# Patient Record
Sex: Female | Born: 1995 | Race: Black or African American | Hispanic: No | Marital: Single | Smoking: Never smoker
Health system: Southern US, Community
[De-identification: ages and names within clinical notes are randomized; demographics above are authoritative.]

## PROBLEM LIST (undated history)

## (undated) DIAGNOSIS — B009 Herpesviral infection, unspecified: Secondary | ICD-10-CM

## (undated) HISTORY — PX: WISDOM TOOTH EXTRACTION: SHX21

---
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
## 2017-03-01 ENCOUNTER — Encounter (HOSPITAL_COMMUNITY): Payer: Self-pay | Admitting: Emergency Medicine

## 2017-03-01 ENCOUNTER — Emergency Department (HOSPITAL_COMMUNITY): Payer: BLUE CROSS/BLUE SHIELD

## 2017-03-01 ENCOUNTER — Emergency Department (HOSPITAL_COMMUNITY)
Admission: EM | Admit: 2017-03-01 | Discharge: 2017-03-01 | Disposition: A | Discharge status: Home / Self Care | Payer: BLUE CROSS/BLUE SHIELD | Attending: Emergency Medicine | Admitting: Emergency Medicine

## 2017-03-01 DIAGNOSIS — M79671 Pain in right foot: Secondary | ICD-10-CM | POA: Diagnosis not present

## 2017-03-01 DIAGNOSIS — Z79899 Other long term (current) drug therapy: Secondary | ICD-10-CM | POA: Insufficient documentation

## 2017-03-01 NOTE — Discharge Instructions (Signed)
You have been seen today for toe pain. There were no acute abnormalities on the x-rays, including no sign of fracture or dislocation. Pain: Take 600 mg of ibuprofen every 6 hours or 440 mg (over the counter dose) to 500 mg (prescription dose) of naproxen every 12 hours or for the next 3 days. After this time, these medications may be used as needed for pain. Take these medications with food to avoid upset stomach. Choose only one of these medications, do not take them together.  Tylenol: Should you continue to have additional pain while taking the ibuprofen or naproxen, you may add in tylenol as needed. Your daily total maximum amount of tylenol from all sources should be limited to 4000mg /day for persons without liver problems, or 2000mg /day for those with liver problems. Ice: May apply ice to the area over the next 24 hours for 15 minutes at a time to reduce swelling. Elevation: Keep the extremity elevated as often as possible to reduce pain and inflammation. Support: You will be weight-bearing as tolerated, which means you can slowly start to put weight on the extremity and increase amount and frequency as pain allows. Follow up: If symptoms are improving, you may follow up with your primary care provider for any continued management. If symptoms are not improving, you may follow up with the orthopedic specialist.

## 2017-03-01 NOTE — ED Notes (Signed)
Bed: WTR5 Expected date:  Expected time:  Means of arrival:  Comments: 

## 2017-03-01 NOTE — ED Provider Notes (Signed)
[facility]-EMERGENCY DEPT Provider Note   CSN: [id number] Arrival date & time: 03/01/17  0935   By signing my name below, I, Anita Levy, attest that this documentation has been prepared under the direction and in the presence of Anita Chavarin, PA-C. Electronically signed, Anita Levy, ED Scribe. 03/01/17. 10:27 AM.  History   Chief Complaint Chief Complaint  Patient presents with  . Foot Pain   The history is provided by the patient and medical records. No language interpreter was used.    Anita Levy is an otherwise healthy 21 y.o. female presenting to the Emergency Department concerning R 4th toe pain onset ~2-3 days ago. She states she was standing, playing video games barefoot at home when the pain began. Associated swelling. She describes 7/10, throbbing, fluctuating pain worse with ambulation. Pt has taken Tylenol and attempted epsom salt soaks with minimal relief. Denies fever, neuro deficits, known trauma, or any other complaints.   History reviewed. No pertinent past medical history.  There are no active problems to display for this patient.   Past Surgical History:  Procedure Laterality Date  . WISDOM TOOTH EXTRACTION      OB History    No data available       Home Medications    Prior to Admission medications   Medication Sig Start Date End Date Taking? Authorizing Provider  amoxicillin-clavulanate (AUGMENTIN) 875-125 MG per tablet Take 1 tablet by mouth 2 (two) times daily. 09/27/14   Ethelda ChickSmith, Kristi M, MD  azithromycin (ZITHROMAX) 250 MG tablet Take 1 tablet (250 mg total) by mouth daily. Take first 2 tablets together, then 1 every day until finished. 05/07/16   Teressa LowerPickering, Vrinda, NP  diphenhydrAMINE (BENADRYL) 25 mg capsule Take 25 mg by mouth every 6 (six) hours as needed. itching     [provider]  DPH-Lido-AlHydr-MgHydr-Simeth (FIRST-MOUTHWASH BLM) SUSP 5 ml every 1-2h prn sore throat - swish gargle spit. 09/26/14   Weber, Dema SeverinSarah L, PA-C    fexofenadine-pseudoephedrine (ALLEGRA-D 24) 180-240 MG per 24 hr tablet Take 1 tablet by mouth daily.    [provider]  guaiFENesin (MUCINEX) 600 MG 12 hr tablet Take by mouth 2 (two) times daily.    [provider]  ibuprofen (ADVIL,MOTRIN) 200 MG tablet Take 200 mg by mouth every 6 (six) hours as needed. pain     [provider]    Family History Family History  Problem Relation Age of Onset  . Cancer Mother     Social History Social History  Substance Use Topics  . Smoking status: Never Smoker  . Smokeless tobacco: Never Used  . Alcohol use No     Allergies   Codeine and Penicillins   Review of Systems Review of Systems  Constitutional: Negative for fever.  Musculoskeletal: Positive for arthralgias.  Skin: Negative for color change and wound.  Neurological: Negative for weakness and numbness.     Physical Exam Updated Vital Signs BP 115/70 (BP Location: Right Arm)   Pulse 84   Temp 98.2 F (36.8 C) (Oral)   Wt 116 lb (52.6 kg)   LMP 02/15/2017 (Exact Date)   SpO2 98%   BMI 18.72 kg/m   Physical Exam  Constitutional: She appears well-developed and well-nourished. No distress.  HENT:  Head: Normocephalic and atraumatic.  Eyes: Conjunctivae are normal.  Neck: Neck supple.  Cardiovascular: Normal rate, regular rhythm and intact distal pulses.   Pulmonary/Chest: Effort normal.  Musculoskeletal: Normal range of motion. She exhibits tenderness. She exhibits no edema  or deformity.  Tenderness over the first joint of the fourth digit on the R foot. No increased warmth, erythema, or area of fluctuance noted. No swelling noted.  Nail is in place. No subungual swelling or discharge.  Neurological: She is alert.  No sensory deficits noted to the toes of the right foot. Strength 5 out of 5 in the foot and ankle.  Skin: Skin is warm and dry. Capillary refill takes less than 2 seconds. She is not diaphoretic. No erythema. No pallor.   Psychiatric: She has a normal mood and affect. Her behavior is normal.  Nursing note and vitals reviewed.    ED Treatments / Results  DIAGNOSTIC STUDIES: Oxygen Saturation is 98% on RA, NL by my interpretation.    COORDINATION OF CARE: 10:24 AM-Discussed next steps with pt. Pt verbalized understanding and is agreeable with the plan. Will order Xr and Rx medications. Pt advised of symptomatic care at home, F/U instructions and return precautions.   Labs (all labs ordered are listed, but only abnormal results are displayed) Labs Reviewed - No data to display  EKG  EKG Interpretation None       Radiology  Dg Foot Complete Right  Result Date: 03/01/2017 CLINICAL DATA:  Right fourth toe pain for 2 or 3 days. No known injury. EXAM: RIGHT FOOT COMPLETE - 3+ VIEW COMPARISON:  Radiographs 08/12/2011.  MRI 08/16/2012. FINDINGS: The mineralization and alignment are normal. There is no evidence of acute fracture or dislocation. The joint spaces are maintained. Mild soft tissue prominence in the fourth toe appears unchanged. No evidence of foreign body. IMPRESSION: Stable right foot radiographs.  No acute findings. Electronically Signed   By: Carey Bullocks M.D.   On: 03/01/2017 10:53   Procedures Procedures (including critical care time)  Medications Ordered in ED Medications - No data to display   Initial Impression / Assessment and Plan / ED Course  I have reviewed the triage vital signs and the nursing notes.  Pertinent labs & imaging results that were available during my care of the patient were reviewed by me and considered in my medical decision making (see chart for details).     Patient presents with toe pain. No neuro deficits noted. No signs of infection. No acute abnormalities on x-ray. PCP follow-up as needed. The patient was given instructions for home care as well as return precautions. Patient voices understanding of these instructions, accepts the plan, and is  comfortable with discharge.  Final Clinical Impressions(s) / ED Diagnoses   Final diagnoses:  Foot pain, right    New Prescriptions Discharge Medication List as of 03/01/2017 11:07 AM    I personally performed the services described in this documentation, which was scribed in my presence. The recorded information has been reviewed and is accurate.   Anselm Pancoast, PA-C 03/03/17 1058    Arby Barrette, MD 03/03/17 [phone]

## 2017-03-01 NOTE — ED Triage Notes (Signed)
Pt reports 3 day hx of pain and swelling in r/foot. Pain noted primarily at 4th toe. Tx with epsom soaks and tylenol.Minimal relief.

## 2017-03-04 ENCOUNTER — Encounter: Payer: Self-pay | Admitting: Podiatry

## 2017-03-04 ENCOUNTER — Ambulatory Visit (INDEPENDENT_AMBULATORY_CARE_PROVIDER_SITE_OTHER): Payer: PRIVATE HEALTH INSURANCE | Admitting: Podiatry

## 2017-03-04 DIAGNOSIS — M7750 Other enthesopathy of unspecified foot: Secondary | ICD-10-CM

## 2017-03-04 MED ORDER — MELOXICAM 15 MG PO TABS: 15.0000 mg | ORAL_TABLET | Freq: Every day | ORAL | 0 refills | Status: DC

## 2017-03-04 NOTE — Progress Notes (Signed)
   Subjective:    Patient ID: Anita Levy, female    DOB: [birth year], 21 y.o.   MRN: [id number]  HPI   I had pain around the 4th toe on my right foot spreading some to the other toes had lot of swelling and it looked purple.  I went to ER X-rays were done they couldn't find anything so sent me here    Review of Systems  Neurological: Positive for headaches.  All other systems reviewed and are negative.      Objective:   Physical Exam        Assessment & Plan:

## 2017-03-04 NOTE — Progress Notes (Signed)
Patient ID: Anita Levy, female   DOB: [birth year], 21 y.o.   MRN: [id number]   HPI: 21 year old female presents the office today for evaluation of right fourth toe pain. Patient states that approximate 3 days ago she had this sudden cramping in her right fourth toe and all of a sudden became painful and swollen. There was some bruising and discoloration also noted. Patient states that is painful to walk. Today the discoloration and swelling is gone however the toe remains painful.   Physical Exam: General: The patient is alert and oriented x3 in no acute distress.  Dermatology: Skin is warm, dry and supple bilateral lower extremities. Negative for open lesions or macerations.  Vascular: Palpable pedal pulses bilaterally. No edema or erythema noted. Capillary refill within normal limits.  Neurological: Epicritic and protective threshold grossly intact bilaterally.   Musculoskeletal Exam: Pain on palpation to the fourth digit right foot with pain with forced plantar flexion of the fourth digit. There is some weakness also noted with plantar flexion of the fourth digit right foot. Range of motion within normal limits to all pedal and ankle joints bilateral. Muscle strength 5/5 in all groups bilateral.   Assessment: 1. FDL Tendinitis fourth digit right foot   Plan of Care:  1. Patient was evaluated. 2. Today a postoperative shoe was dispensed. Patient be weightbearing in postoperative shoe times 2-3 weeks when necessary pain 3. Prescription for meloxicam 15 mg 4. Return to clinic when necessary   Felecia ShellingBrent M. Dominique Ressel, DPM [facility]  Dr. Felecia ShellingBrent M. Lashae Wollenberg, DPM    [address]                                        [place], [place] [address]                Office [phone]  Fax [phone]

## 2017-03-18 ENCOUNTER — Ambulatory Visit: Payer: PRIVATE HEALTH INSURANCE | Admitting: Podiatry

## 2017-09-07 IMAGING — CR DG CHEST 2V
2 series · 2 of 2 positions shown · non-contrast
Comparison: None.

CLINICAL DATA: Patient with coughing at night while lying down.
Shortness of breath. No chest pain.

EXAM:
CHEST  2 VIEW

[w chest pa]
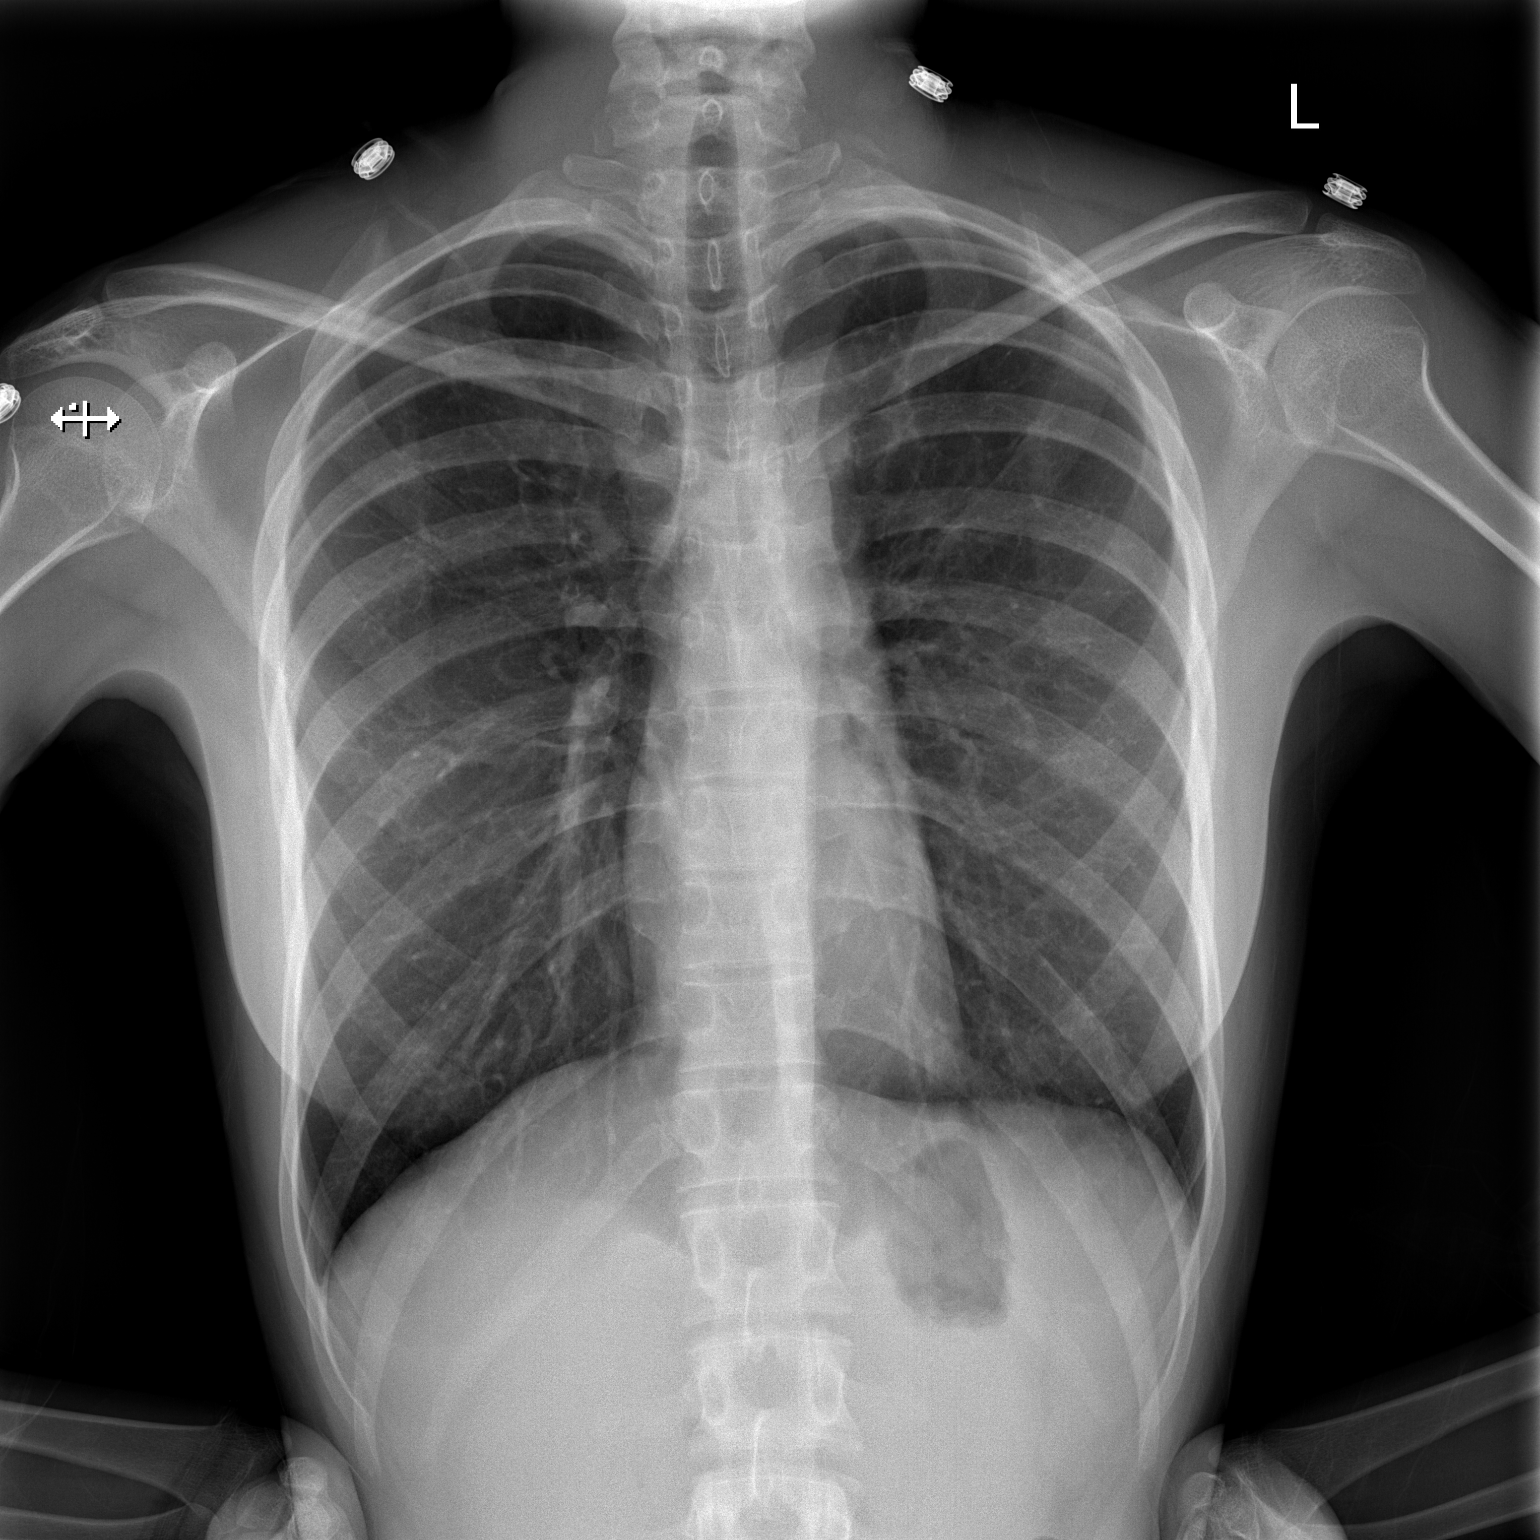

[w chest lat]
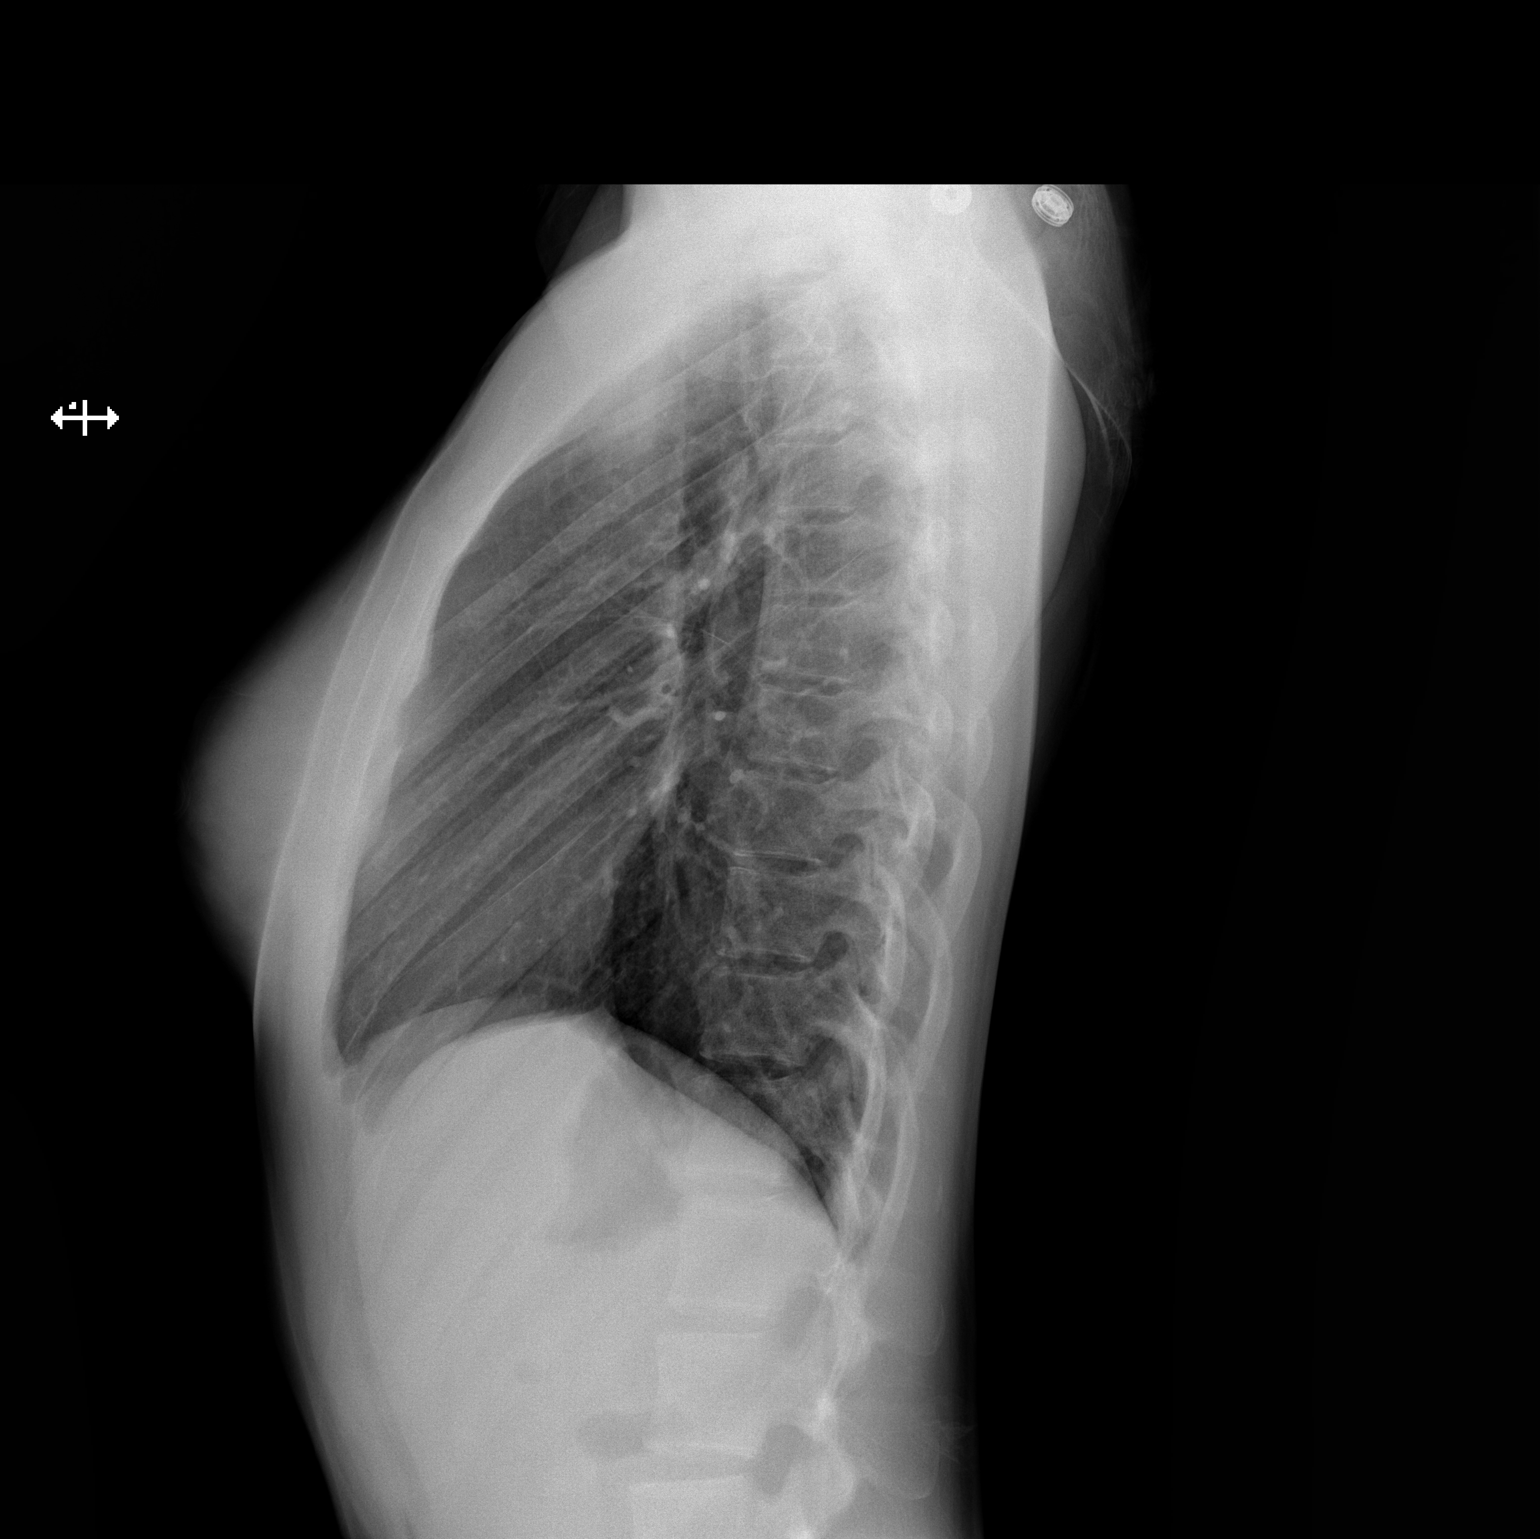

[2 of 2 positions shown; findings below may reference images not displayed]

FINDINGS: The heart size and mediastinal contours are within normal limits.
Both lungs are clear. The visualized skeletal structures are
unremarkable.
IMPRESSION: No active cardiopulmonary disease.

## 2017-11-06 ENCOUNTER — Ambulatory Visit: Payer: 59 | Admitting: Emergency Medicine

## 2017-11-06 ENCOUNTER — Encounter: Payer: Self-pay | Admitting: Emergency Medicine

## 2017-11-06 ENCOUNTER — Other Ambulatory Visit: Payer: Self-pay

## 2017-11-06 ENCOUNTER — Ambulatory Visit: Payer: PRIVATE HEALTH INSURANCE | Admitting: Emergency Medicine

## 2017-11-06 VITALS — BP 102/70 | HR 67 | Temp 100.3°F | Resp 16 | Ht 65.5 in | Wt 118.0 lb

## 2017-11-06 DIAGNOSIS — R509 Fever, unspecified: Secondary | ICD-10-CM

## 2017-11-06 DIAGNOSIS — J029 Acute pharyngitis, unspecified: Secondary | ICD-10-CM

## 2017-11-06 LAB — POCT RAPID STREP A (OFFICE): RAPID STREP A SCREEN: NEGATIVE

## 2017-11-06 MED ORDER — AZITHROMYCIN 250 MG PO TABS: ORAL_TABLET | ORAL | 0 refills | Status: DC

## 2017-11-06 NOTE — Progress Notes (Signed)
Anita Levy 22 y.o.   Chief Complaint  Patient presents with  . Dizziness    started 11/04/17  . Sore Throat    HISTORY OF PRESENT ILLNESS: This is a 22 y.o. female complaining of sore throat and fever for 2 days.  Sore Throat   This is a new problem. The current episode started in the past 7 days. The problem has been gradually worsening. The maximum temperature recorded prior to her arrival was 101 - 101.9 F. The fever has been present for 1 to 2 days. The pain is moderate. Associated symptoms include headaches, swollen glands and trouble swallowing. Pertinent negatives include no abdominal pain, congestion, coughing, diarrhea, drooling, ear pain, neck pain, shortness of breath or vomiting. She has had no exposure to strep or mono.     Prior to Admission medications   Medication Sig Start Date End Date Taking? Authorizing Provider  diphenhydrAMINE (BENADRYL) 25 mg capsule Take 25 mg by mouth every 6 (six) hours as needed. itching    Yes [provider]  fexofenadine-pseudoephedrine (ALLEGRA-D 24) 180-240 MG per 24 hr tablet Take 1 tablet by mouth daily.   Yes [provider]  guaiFENesin (MUCINEX) 600 MG 12 hr tablet Take by mouth 2 (two) times daily.   Yes [provider]  ibuprofen (ADVIL,MOTRIN) 200 MG tablet Take 200 mg by mouth every 6 (six) hours as needed. pain    Yes [provider]  norethindrone-ethinyl estradiol (JUNEL FE,GILDESS FE,LOESTRIN FE) 1-20 MG-MCG tablet Take 1 tablet by mouth daily.   Yes [provider]  amoxicillin-clavulanate (AUGMENTIN) 875-125 MG per tablet Take 1 tablet by mouth 2 (two) times daily. Patient not taking: Reported on 03/04/2017 09/27/14   Ethelda ChickSmith, Kristi M, MD  azithromycin (ZITHROMAX) 250 MG tablet Take 1 tablet (250 mg total) by mouth daily. Take first 2 tablets together, then 1 every day until finished. Patient not taking: Reported on 03/04/2017 05/07/16   Teressa LowerPickering, Vrinda, NP    DPH-Lido-AlHydr-MgHydr-Simeth (FIRST-MOUTHWASH BLM) SUSP 5 ml every 1-2h prn sore throat - swish gargle spit. Patient not taking: Reported on 03/04/2017 09/26/14   Morrell RiddleWeber, Sarah L, PA-C  meloxicam (MOBIC) 15 MG tablet Take 1 tablet (15 mg total) by mouth daily. Patient not taking: Reported on 11/06/2017 03/04/17   Felecia ShellingEvans, Brent M, DPM    Allergies  Allergen Reactions  . Codeine Itching  . Penicillins Swelling    There are no active problems to display for this patient.   No past medical history on file.  Past Surgical History:  Procedure Laterality Date  . WISDOM TOOTH EXTRACTION      Social History   Socioeconomic History  . Marital status: Single    Spouse name: Not on file  . Number of children: Not on file  . Years of education: Not on file  . Highest education level: Not on file  Occupational History  . Not on file  Social Needs  . Financial resource strain: Not on file  . Food insecurity:    Worry: Not on file    Inability: Not on file  . Transportation needs:    Medical: Not on file    Non-medical: Not on file  Tobacco Use  . Smoking status: Never Smoker  . Smokeless tobacco: Never Used  Substance and Sexual Activity  . Alcohol use: No    Alcohol/week: 0.0 oz  . Drug use: No  . Sexual activity: Not on file  Lifestyle  . Physical activity:    Days  per week: Not on file    Minutes per session: Not on file  . Stress: Not on file  Relationships  . Social connections:    Talks on phone: Not on file    Gets together: Not on file    Attends religious service: Not on file    Active member of club or organization: Not on file    Attends meetings of clubs or organizations: Not on file    Relationship status: Not on file  . Intimate partner violence:    Fear of current or ex partner: Not on file    Emotionally abused: Not on file    Physically abused: Not on file    Forced sexual activity: Not on file  Other Topics Concern  . Not on file  Social History  Narrative  . Not on file    Family History  Problem Relation Age of Onset  . Cancer Mother      Review of Systems  Constitutional: Positive for chills and fever.  HENT: Positive for sore throat and trouble swallowing. Negative for congestion, drooling and ear pain.   Eyes: Negative.  Negative for discharge and redness.  Respiratory: Negative for cough and shortness of breath.   Cardiovascular: Negative.  Negative for chest pain and palpitations.  Gastrointestinal: Negative for abdominal pain, blood in stool, diarrhea, nausea and vomiting.  Genitourinary: Negative.  Negative for dysuria and hematuria.  Musculoskeletal: Negative for myalgias and neck pain.  Skin: Negative.  Negative for rash.  Neurological: Positive for dizziness and headaches.  Endo/Heme/Allergies: Negative.   All other systems reviewed and are negative.   Vitals:   11/06/17 0826  BP: 102/70  Pulse: 67  Resp: 16  Temp: 100.3 F (37.9 C)  SpO2: 97%    Physical Exam  Constitutional: She is oriented to person, place, and time. She appears well-developed and well-nourished.  HENT:  Head: Normocephalic and atraumatic.  Right Ear: External ear normal.  Left Ear: External ear normal.  Nose: Nose normal.  Mouth/Throat: Uvula is midline. Oropharyngeal exudate, posterior oropharyngeal edema and posterior oropharyngeal erythema present. No tonsillar abscesses.  Eyes: Pupils are equal, round, and reactive to light. Conjunctivae and EOM are normal.  Neck: Normal range of motion. Neck supple.  Cardiovascular: Normal rate and regular rhythm.  Pulmonary/Chest: Effort normal and breath sounds normal.  Abdominal: Soft. There is no tenderness.  Musculoskeletal: Normal range of motion.  Neurological: She is alert and oriented to person, place, and time. No sensory deficit. She exhibits normal muscle tone. Coordination normal.  Skin: Skin is warm and dry. Capillary refill takes less than 2 seconds. No rash noted.    Psychiatric: She has a normal mood and affect. Her behavior is normal.  Vitals reviewed.  Results for orders placed or performed in visit on 11/06/17 (from the past 24 hour(s))  POCT rapid strep A     Status: None   Collection Time: 11/06/17  8:51 AM  Result Value Ref Range   Rapid Strep A Screen Negative Negative    Acute pharyngitis Clinically patient has all the findings of strep throat.  Will treat accordingly.  Allergic to penicillin will start azithromycin.   ASSESSMENT & PLAN: Anita Levy was seen today for dizziness and sore throat.  Diagnoses and all orders for this visit:  Acute pharyngitis, unspecified etiology  Sore throat -     Culture, Group A Strep -     POCT rapid strep A -     azithromycin (ZITHROMAX) 250  MG tablet; Sig as indicated  Fever, unspecified fever cause    Patient Instructions       IF you received an x-ray today, you will receive an invoice from [facility]. Please contact [facility] at [phone] with questions or concerns regarding your invoice.   IF you received labwork today, you will receive an invoice from [place]. Please contact [facility] at [phone] with questions or concerns regarding your invoice.   Our billing staff will not be able to assist you with questions regarding bills from these companies.  You will be contacted with the lab results as soon as they are available. The fastest way to get your results is to activate your My Chart account. Instructions are located on the last page of this paperwork. If you have not heard from [place] regarding the results in 2 weeks, please contact this office.     Pharyngitis Pharyngitis is a sore throat (pharynx). There is redness, pain, and swelling of your throat. Follow these instructions at home:  Drink enough fluids to keep your pee (urine) clear or pale yellow.  Only take medicine as told by your doctor. ? You may get sick again if you do not take medicine as  told. Finish your medicines, even if you start to feel better. ? Do not take aspirin.  Rest.  Rinse your mouth (gargle) with salt water ( tsp of salt per 1 qt of water) every 1-2 hours. This will help the pain.  If you are not at risk for choking, you can suck on hard candy or sore throat lozenges. Contact a doctor if:  You have large, tender lumps on your neck.  You have a rash.  You cough up green, yellow-brown, or bloody spit. Get help right away if:  You have a stiff neck.  You drool or cannot swallow liquids.  You throw up (vomit) or are not able to keep medicine or liquids down.  You have very bad pain that does not go away with medicine.  You have problems breathing (not from a stuffy nose). This information is not intended to replace advice given to you by your health care provider. Make sure you discuss any questions you have with your health care provider. Document Released: 01/16/2008 Document Revised: 01/05/2016 Document Reviewed: 04/06/2013 Elsevier Interactive Patient Education  2017 Elsevier Inc.      Edwina Barth, MD [facility]

## 2017-11-06 NOTE — Assessment & Plan Note (Signed)
Clinically patient has all the findings of strep throat.  Will treat accordingly.  Allergic to penicillin will start azithromycin.

## 2017-11-06 NOTE — Patient Instructions (Addendum)
     IF you received an x-ray today, you will receive an invoice from [facility]. Please contact [facility] at [phone] with questions or concerns regarding your invoice.   IF you received labwork today, you will receive an invoice from [facility]. Please contact [facility] at [phone] with questions or concerns regarding your invoice.   Our billing staff will not be able to assist you with questions regarding bills from these companies.  You will be contacted with the lab results as soon as they are available. The fastest way to get your results is to activate your My Chart account. Instructions are located on the last page of this paperwork. If you have not heard from us regarding the results in 2 weeks, please contact this office.     Pharyngitis Pharyngitis is a sore throat (pharynx). There is redness, pain, and swelling of your throat. Follow these instructions at home:  Drink enough fluids to keep your pee (urine) clear or pale yellow.  Only take medicine as told by your doctor.  You may get sick again if you do not take medicine as told. Finish your medicines, even if you start to feel better.  Do not take aspirin.  Rest.  Rinse your mouth (gargle) with salt water ( tsp of salt per 1 qt of water) every 1-2 hours. This will help the pain.  If you are not at risk for choking, you can suck on hard candy or sore throat lozenges. Contact a doctor if:  You have large, tender lumps on your neck.  You have a rash.  You cough up green, yellow-brown, or bloody spit. Get help right away if:  You have a stiff neck.  You drool or cannot swallow liquids.  You throw up (vomit) or are not able to keep medicine or liquids down.  You have very bad pain that does not go away with medicine.  You have problems breathing (not from a stuffy nose). This information is not intended to replace advice given to you by your health care provider. Make sure you  discuss any questions you have with your health care provider. Document Released: 01/16/2008 Document Revised: 01/05/2016 Document Reviewed: 04/06/2013 Elsevier Interactive Patient Education  2017 Elsevier Inc.  

## 2017-11-08 ENCOUNTER — Encounter: Payer: Self-pay | Admitting: *Deleted

## 2017-11-08 LAB — CULTURE, GROUP A STREP: Strep A Culture: NEGATIVE

## 2017-11-08 NOTE — Progress Notes (Signed)
Letter sent.

## 2017-11-09 ENCOUNTER — Telehealth: Payer: Self-pay | Admitting: Emergency Medicine

## 2017-11-09 NOTE — Telephone Encounter (Signed)
Phone call to patient's mother Aggie Cosierheresa. She states lethargy started Friday, but patient is looking and feeling better today- she is much less lethargic. Ann-Marie has been able to eat today. Recommended reviewing instructions for medications, reviewed information from [facility]. Recommended Aggie Cosierheresa call with any new or worsening symptoms. She is agreeable.

## 2017-11-09 NOTE — Telephone Encounter (Signed)
Pt.'s mother Reports the pt. being nauseous, lethargic, and that her right side hurts. All symptoms started after taking the z-pack prescribed at her visit with Dr. Alvy BimlerSagardia on 11/06/17. The pt.s mother declined to make an appt. And wished for a message to the doctor asking for advice be written on her behalf. The pt. Is willing to make an appt. should it be needed   Best Number: [phone]  Please Advise

## 2017-11-11 NOTE — Telephone Encounter (Signed)
Thanks

## 2017-12-17 DIAGNOSIS — N644 Mastodynia: Secondary | ICD-10-CM | POA: Diagnosis not present

## 2018-03-11 DIAGNOSIS — R5383 Other fatigue: Secondary | ICD-10-CM | POA: Diagnosis not present

## 2018-03-11 DIAGNOSIS — R109 Unspecified abdominal pain: Secondary | ICD-10-CM | POA: Diagnosis not present

## 2018-03-11 DIAGNOSIS — N912 Amenorrhea, unspecified: Secondary | ICD-10-CM | POA: Diagnosis not present

## 2018-08-15 ENCOUNTER — Emergency Department (HOSPITAL_COMMUNITY): Payer: BLUE CROSS/BLUE SHIELD

## 2018-08-15 ENCOUNTER — Emergency Department (HOSPITAL_COMMUNITY)
Admission: EM | Admit: 2018-08-15 | Discharge: 2018-08-15 | Disposition: A | Discharge status: Home / Self Care | Payer: BLUE CROSS/BLUE SHIELD | Attending: Emergency Medicine | Admitting: Emergency Medicine

## 2018-08-15 ENCOUNTER — Other Ambulatory Visit: Payer: Self-pay

## 2018-08-15 ENCOUNTER — Encounter (HOSPITAL_COMMUNITY): Payer: Self-pay

## 2018-08-15 DIAGNOSIS — Z79899 Other long term (current) drug therapy: Secondary | ICD-10-CM | POA: Insufficient documentation

## 2018-08-15 DIAGNOSIS — M79672 Pain in left foot: Secondary | ICD-10-CM | POA: Insufficient documentation

## 2018-08-15 MED ORDER — ACETAMINOPHEN 325 MG PO TABS
650.0000 mg | ORAL_TABLET | Freq: Once | ORAL | Status: AC
  Administered 2018-08-15: 650 mg via ORAL
  Filled 2018-08-15: qty 2

## 2018-08-15 NOTE — ED Provider Notes (Signed)
[facility]-EMERGENCY DEPT Provider Note   CSN: [id number] Arrival date & time: 08/15/18  1748     History   Chief Complaint Chief Complaint  Patient presents with  . Left Foot Injury    HPI Anita Levy is a 23 y.o. female.  HPI   23 year old female presents with a 6-week history of left foot pain.  Patient states symptoms started after she hit the top of her foot on a porcelain soap container.  Patient then 2 weeks later hit her foot again.  Pain has persisted since then.  She put herself in a postop shoe to try to help with her pain but this has not helped.  She states her pain is now on the lateral aspect of the left foot and at the third through fifth MTPs.  She denies any numbness, tingling, decreased range of motion of ankle.  She denies any redness, swelling, fevers.  History reviewed. No pertinent past medical history.  Patient Active Problem List   Diagnosis Date Noted  . Acute pharyngitis 11/06/2017  . Sore throat 11/06/2017  . Fever 11/06/2017    Past Surgical History:  Procedure Laterality Date  . WISDOM TOOTH EXTRACTION       OB History   No obstetric history on file.      Home Medications    Prior to Admission medications   Medication Sig Start Date End Date Taking? Authorizing Provider  amoxicillin-clavulanate (AUGMENTIN) 875-125 MG per tablet Take 1 tablet by mouth 2 (two) times daily. Patient not taking: Reported on 03/04/2017 09/27/14   Ethelda ChickSmith, Kristi M, MD  azithromycin (ZITHROMAX) 250 MG tablet Take 1 tablet (250 mg total) by mouth daily. Take first 2 tablets together, then 1 every day until finished. Patient not taking: Reported on 03/04/2017 05/07/16   Teressa LowerPickering, Vrinda, NP  azithromycin [facility](ZITHROMAX) 250 MG tablet Sig as indicated 11/06/17   Georgina QuintSagardia, Miguel Jose, MD  diphenhydrAMINE (BENADRYL) 25 mg capsule Take 25 mg by mouth every 6 (six) hours as needed. itching     [provider]  DPH-Lido-AlHydr-MgHydr-Simeth  (FIRST-MOUTHWASH BLM) SUSP 5 ml every 1-2h prn sore throat - swish gargle spit. Patient not taking: Reported on 03/04/2017 09/26/14   Valarie ConesWeber, Dema SeverinSarah L, PA-C  fexofenadine-pseudoephedrine (ALLEGRA-D 24) 180-240 MG per 24 hr tablet Take 1 tablet by mouth daily.    [provider]  guaiFENesin (MUCINEX) 600 MG 12 hr tablet Take by mouth 2 (two) times daily.    [provider]  ibuprofen (ADVIL,MOTRIN) 200 MG tablet Take 200 mg by mouth every 6 (six) hours as needed. pain     [provider]  meloxicam (MOBIC) 15 MG tablet Take 1 tablet (15 mg total) by mouth daily. Patient not taking: Reported on 11/06/2017 03/04/17   Felecia ShellingEvans, Brent M, DPM  norethindrone-ethinyl estradiol (JUNEL FE,GILDESS FE,LOESTRIN FE) 1-20 MG-MCG tablet Take 1 tablet by mouth daily.    [provider]    Family History Family History  Problem Relation Age of Onset  . Cancer Mother     Social History Social History   Tobacco Use  . Smoking status: Never Smoker  . Smokeless tobacco: Never Used  Substance Use Topics  . Alcohol use: No    Alcohol/week: 0.0 standard drinks  . Drug use: No     Allergies   Codeine and Penicillins   Review of Systems Review of Systems  Constitutional: Negative for chills and fever.  Respiratory: Negative for shortness of breath.   Cardiovascular: Negative  for chest pain.  Gastrointestinal: Negative for abdominal pain, nausea and vomiting.  Musculoskeletal: Positive for arthralgias (left foot pain). Negative for joint swelling.  Skin: Negative for rash and wound.     Physical Exam Updated Vital Signs BP 131/83 (BP Location: Right Arm)   Pulse 93   Temp 98.9 F (37.2 C) (Oral)   Resp 18   Ht 5\' 7"  (1.702 m)   Wt 50.8 kg   LMP 07/12/2018 (Approximate)   SpO2 100%   BMI 17.54 kg/m   Physical Exam Vitals signs and nursing note reviewed.  Constitutional:      Appearance: She is well-developed.  HENT:     Head: Normocephalic and atraumatic.   Eyes:     Conjunctiva/sclera: Conjunctivae normal.  Neck:     Musculoskeletal: Neck supple.  Cardiovascular:     Rate and Rhythm: Normal rate and regular rhythm.     Heart sounds: Normal heart sounds. No murmur.  Pulmonary:     Effort: Pulmonary effort is normal. No respiratory distress.     Breath sounds: Normal breath sounds. No wheezing or rales.  Abdominal:     General: Bowel sounds are normal. There is no distension.     Palpations: Abdomen is soft.     Tenderness: There is no abdominal tenderness.  Musculoskeletal: Normal range of motion.        General: No tenderness or deformity.       Feet:  Skin:    General: Skin is warm and dry.     Findings: No erythema or rash.  Neurological:     Mental Status: She is alert and oriented to person, place, and time.  Psychiatric:        Behavior: Behavior normal.      ED Treatments / Results  Labs (all labs ordered are listed, but only abnormal results are displayed) Labs Reviewed - No data to display  EKG None  Radiology Dg Foot Complete Left  Result Date: 08/15/2018 CLINICAL DATA:  The patient suffered a blow to the left foot 07/10/2018. Continued pain and difficulty weight-bearing. Initial encounter. EXAM: LEFT FOOT - COMPLETE 3+ VIEW COMPARISON:  None. FINDINGS: There is no evidence of fracture or dislocation. There is no evidence of arthropathy or other focal bone abnormality. Soft tissues are unremarkable. IMPRESSION: Normal exam. Electronically Signed   By: Drusilla Kannerhomas  Dalessio M.D.   On: 08/15/2018 20:31    Procedures Procedures (including critical care time)  Medications Ordered in ED Medications  acetaminophen (TYLENOL) tablet 650 mg (has no administration in time range)     Initial Impression / Assessment and Plan / ED Course  I have reviewed the triage vital signs and the nursing notes.  Pertinent labs & imaging results that were available during my care of the patient were reviewed by me and considered in my  medical decision making (see chart for details).     Patient presents with a 6-week history of left foot pain.  Patient is resting comfortably in a wheelchair, no acute distress, nontoxic, non-lethargic.  No signs stable.  X-ray shows no acute findings.  Has been ambulating on the lateral aspect of her left foot since injury to the top of her foot.  And now she has pain in the lateral aspect of her foot.  Her foot has no erythema/edema/ecchymosis.  It is neurovascularly intact.  When the provider tries to get patient to ambulate by putting her foot flat on the ground, patient ambulates much better and without difficulty.  She is still requesting crutches due to pain.  Encouraged her to follow-up outpatient with orthopedics.  She will be going back to school at [employer] and mother states they will follow-up there.  Ready and stable for discharge.  Final Clinical Impressions(s) / ED Diagnoses   Final diagnoses:  None    ED Discharge Orders    None       Rueben Bash 08/15/18 2150    Arby Barrette, MD 08/17/18 2214

## 2018-08-15 NOTE — Discharge Instructions (Signed)
Use crutches as needed for comfort.  Ice affected area.  Take Tylenol or Motrin as needed for pain.  Follow-up with orthopedics within a week for continued evaluation.  Return to the ED immediately for new or worsening symptoms, such as increased pain, swelling, numbness, decreased range of motion or any concerns at all.

## 2018-08-15 NOTE — ED Triage Notes (Signed)
Patient comes from home with mother at side. Patient is AOx4 and unable to put any weight on left foot. Patient hit left foot on porcelain soap dish in shower during thanksgiving weekend. Left foot is not getting any better and pain has been increasing in left foot. Patient has no other complaints.

## 2019-12-16 IMAGING — CR DG FOOT COMPLETE 3+V*L*
3 series · 3 of 3 positions shown · non-contrast
Comparison: None.

CLINICAL DATA: The patient suffered a blow to the left foot
07/10/2018. Continued pain and difficulty weight-bearing. Initial
encounter.

EXAM:
LEFT FOOT - COMPLETE 3+ VIEW

[x foot ap left]
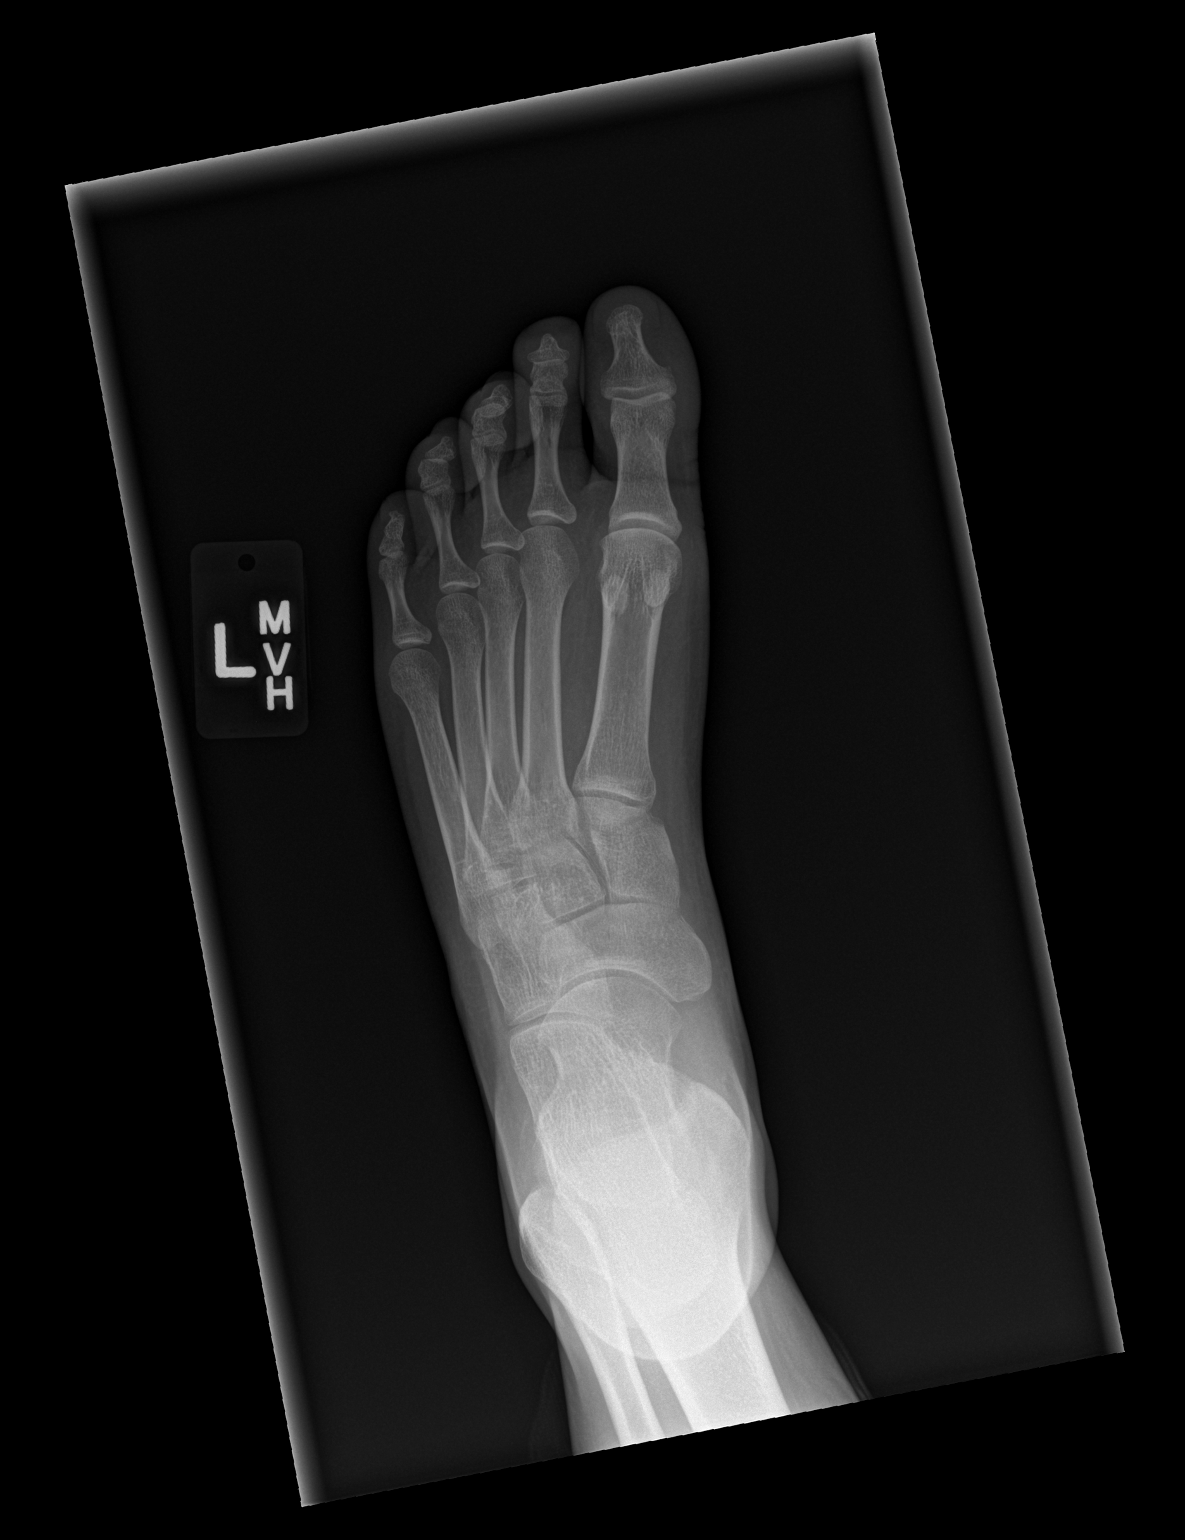

[x foot obl left]
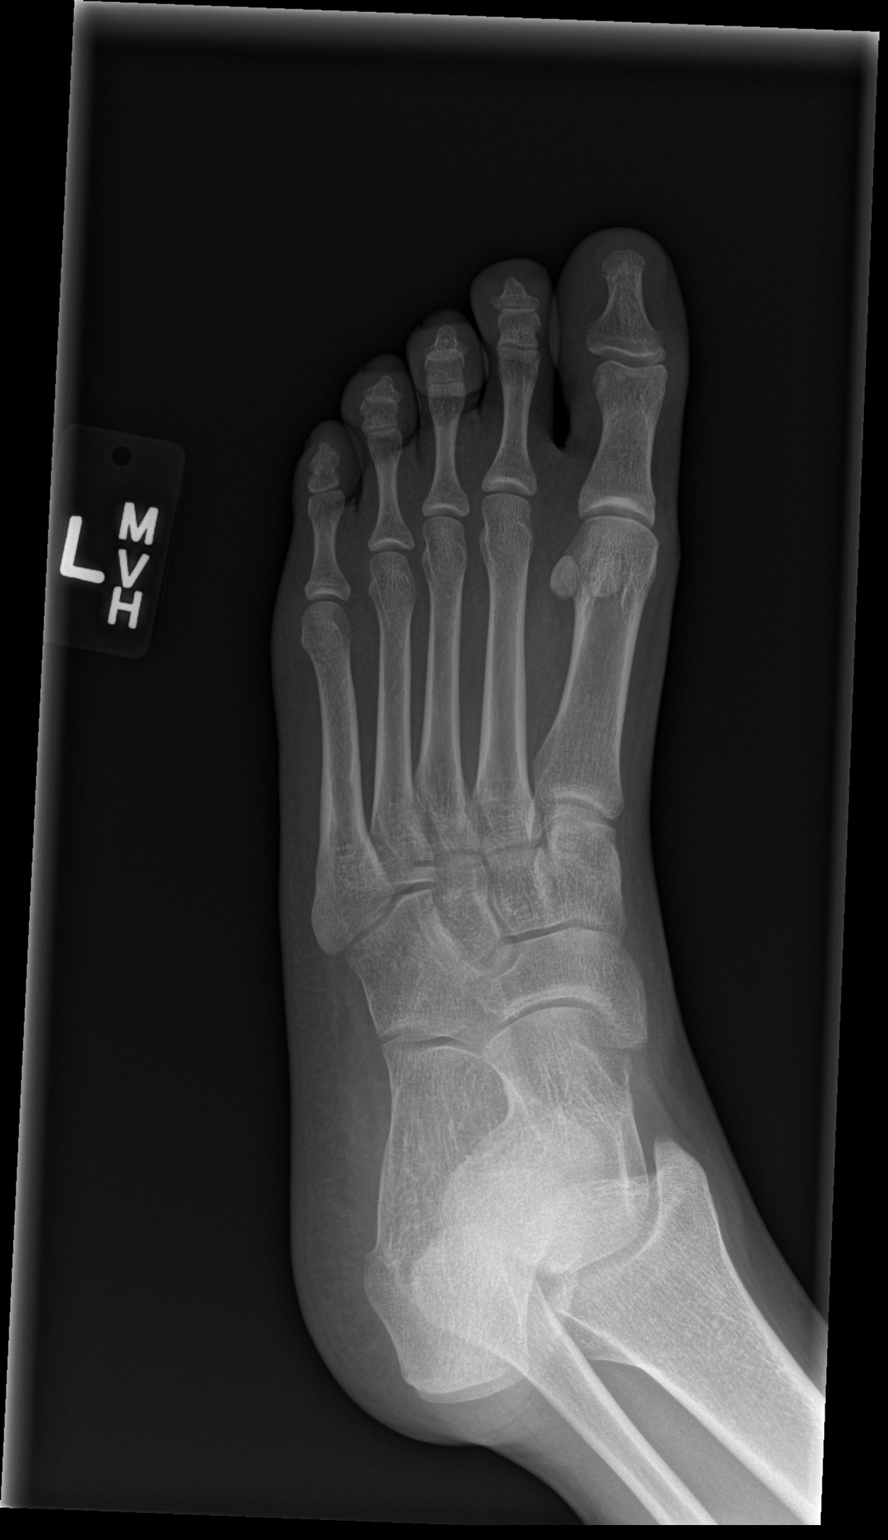

[x foot lat left]
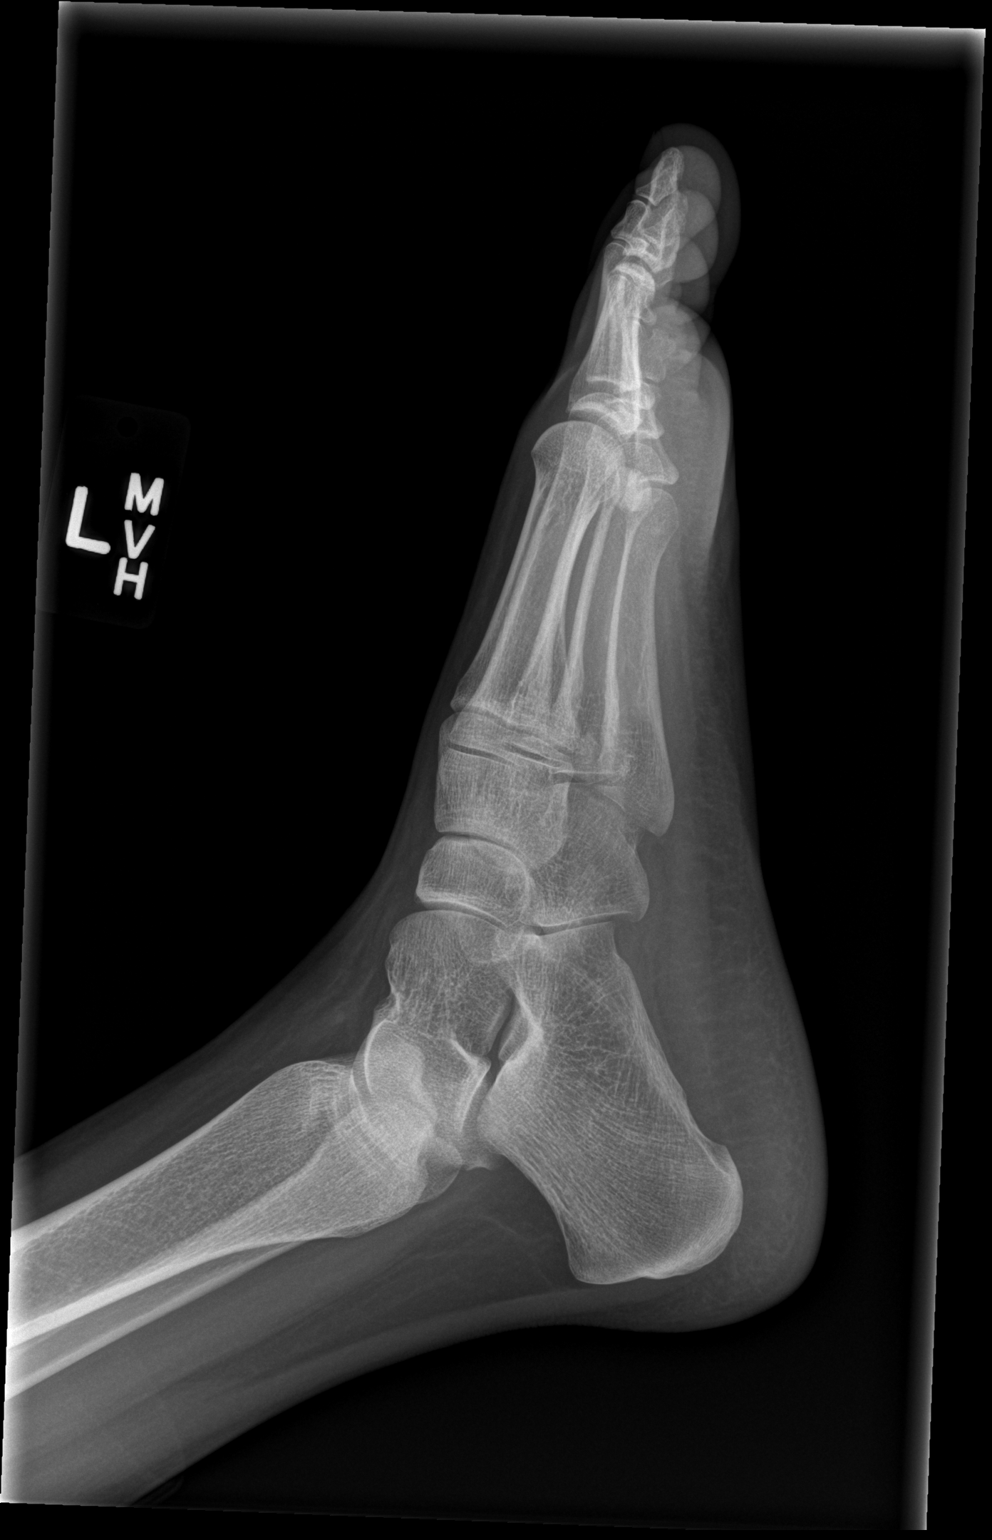

[3 of 3 positions shown; findings below may reference images not displayed]

FINDINGS: There is no evidence of fracture or dislocation. There is no
evidence of arthropathy or other focal bone abnormality. Soft
tissues are unremarkable.
IMPRESSION: Normal exam.

## 2020-08-25 ENCOUNTER — Other Ambulatory Visit: Payer: Self-pay

## 2020-08-25 DIAGNOSIS — Z20822 Contact with and (suspected) exposure to covid-19: Secondary | ICD-10-CM

## 2020-08-28 LAB — NOVEL CORONAVIRUS, NAA: SARS-CoV-2, NAA: DETECTED — AB

## 2023-06-21 ENCOUNTER — Other Ambulatory Visit: Payer: Self-pay

## 2023-06-21 ENCOUNTER — Encounter (HOSPITAL_COMMUNITY): Payer: Self-pay | Admitting: Anesthesiology

## 2023-06-21 ENCOUNTER — Encounter (HOSPITAL_COMMUNITY): Payer: Self-pay | Admitting: Obstetrics and Gynecology

## 2023-06-21 NOTE — Progress Notes (Signed)
PCP - none Cardiologist - none  Chest x-ray - n/a EKG - n/a Stress Test - n/a ECHO - n/a Cardiac Cath - n/a  ICD Pacemaker/Loop - n/a  Sleep Study -  n/a  Diabetes - n/a  ERAS - Clear liquids til 12 Noon DOS.  STOP now taking any Aspirin (unless otherwise instructed by your surgeon), Aleve, Naproxen, Ibuprofen, Motrin, Advil, Goody's, BC's, all herbal medications, fish oil, and all vitamins.   Coronavirus Screening Do you have any of the following symptoms:  Cough yes/no: No Fever (>100.21F)  yes/no: No Runny nose yes/no: No Sore throat yes/no: No Difficulty breathing/shortness of breath  yes/no: No  Have you traveled in the last 14 days and where? yes/no: No  Patient verbalized understanding of instructions that were given via phone.

## 2023-06-23 DIAGNOSIS — Z880 Allergy status to penicillin: Secondary | ICD-10-CM

## 2023-06-23 DIAGNOSIS — B009 Herpesviral infection, unspecified: Secondary | ICD-10-CM | POA: Insufficient documentation

## 2023-06-23 DIAGNOSIS — O021 Missed abortion: Secondary | ICD-10-CM | POA: Diagnosis present

## 2023-06-23 NOTE — H&P (Signed)
Ryneisha Debski is an 27 y.o. female, G1P0, presenting for scheduled D&E due to missed AB of twins at 12 weeks by ? Dates, 9 weeks by [place].  Patient was seen in office on 06/20/23 for [place] for dating and viability. LMP was 03/24/23--US showed twin pregnancy, with fetal demise of both twins, verified by doppler flow.  Patient had denied any bleeding or pain.  Options for management were reviewed, with patient electing to proceed with D&E on 06/24/23.    She requested repeat US at [facility] on the am of 06/24/23 for verification of loss.  Fetal demise of both twins was confirmed and reviewed with patient by myself.  Blood type is unknown at the time of this note--will need to be drawn at the time of procedure.    Patient was unsure regarding genetic testing of the POC--Dr. Normand Sloop will review this with patient at time of procedure.  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 She has never used smokeless tobacco. She reports that she does not currently use drugs after having used the following drugs: Marijuana. She reports that she does not drink alcohol.  ALLERGIES/MEDS:  Allergies:  Allergies  Allergen Reactions   Codeine Itching   Penicillins Swelling    No medications prior to admission.     ROS: Denies bleeding  or pain  Height 5\' 7"  (1.702 m), weight 51.7 kg. Physical Exam Chest clear Heart RRR without murmur Abd soft, NT Fundal height 9 weeks, NT Pelvic deferred Ext WNL   ASSESSMENT: Missed AB of twins at 12 weeks by dates, 9 weeks by [place] Unknown blood type PCN allergy  PLAN: Admit for outpatient D&E per consult with Dr. Normand Sloop. Check ABO/RH at time of admission. Dr. Normand Sloop to review option of Anora testing of POC. Support to patient for loss.   Nigel Bridgeman, CNM 06/23/2023, 8:50 AM

## 2023-06-24 ENCOUNTER — Ambulatory Visit (HOSPITAL_COMMUNITY)
Admission: RE | Admit: 2023-06-24 | Discharge: 2023-06-24 | Disposition: A | Discharge status: Home / Self Care | Payer: BC Managed Care – PPO | Attending: Obstetrics and Gynecology | Admitting: Obstetrics and Gynecology

## 2023-06-24 ENCOUNTER — Other Ambulatory Visit: Payer: Self-pay | Admitting: Obstetrics and Gynecology

## 2023-06-24 ENCOUNTER — Other Ambulatory Visit: Payer: Self-pay

## 2023-06-24 ENCOUNTER — Encounter (HOSPITAL_COMMUNITY)
Admission: RE | Disposition: A | Discharge status: Home / Self Care | Payer: Self-pay | Source: Home / Self Care | Attending: Obstetrics and Gynecology

## 2023-06-24 ENCOUNTER — Encounter (HOSPITAL_COMMUNITY): Payer: Self-pay | Admitting: Obstetrics and Gynecology

## 2023-06-24 DIAGNOSIS — O021 Missed abortion: Secondary | ICD-10-CM | POA: Insufficient documentation

## 2023-06-24 DIAGNOSIS — Z538 Procedure and treatment not carried out for other reasons: Secondary | ICD-10-CM | POA: Diagnosis not present

## 2023-06-24 DIAGNOSIS — Z880 Allergy status to penicillin: Secondary | ICD-10-CM

## 2023-06-24 HISTORY — DX: Herpesviral infection, unspecified: B00.9

## 2023-06-24 SURGERY — DILATION AND EVACUATION, UTERUS
Anesthesia: General

## 2023-06-24 SURGICAL SUPPLY — 22 items
CATH ROBINSON RED A/P 16FR (CATHETERS) ×2 IMPLANT
DILATOR CANAL MILEX (MISCELLANEOUS) IMPLANT
FILTER UTR ASPR ASSEMBLY (MISCELLANEOUS) ×2 IMPLANT
GLOVE BIO SURGEON STRL SZ 6.5 (GLOVE) ×2 IMPLANT
GLOVE BIOGEL PI IND STRL 7.0 (GLOVE) ×4 IMPLANT
GOWN STRL REUS W/ TWL LRG LVL3 (GOWN DISPOSABLE) ×2 IMPLANT
GOWN STRL REUS W/TWL LRG LVL3 (GOWN DISPOSABLE)
HOSE CONNECTING 18IN BERKELEY (TUBING) ×2 IMPLANT
KIT BERKELEY 1ST TRI 3/8 NO TR (MISCELLANEOUS) ×2 IMPLANT
KIT BERKELEY 1ST TRIMESTER 3/8 (MISCELLANEOUS) ×2 IMPLANT
NS IRRIG 1000ML POUR BTL (IV SOLUTION) ×1 IMPLANT
PACK VAGINAL MINOR WOMEN LF (CUSTOM PROCEDURE TRAY) ×1 IMPLANT
PAD OB MATERNITY 4.3X12.25 (PERSONAL CARE ITEMS) ×1 IMPLANT
SET BERKELEY SUCTION TUBING (SUCTIONS) ×1 IMPLANT
SPIKE FLUID TRANSFER (MISCELLANEOUS) ×2 IMPLANT
SYR 20ML LL LF (SYRINGE) ×2 IMPLANT
TOWEL GREEN STERILE FF (TOWEL DISPOSABLE) ×4 IMPLANT
UNDERPAD 30X36 HEAVY ABSORB (UNDERPADS AND DIAPERS) ×2 IMPLANT
VACURETTE 10 RIGID CVD (CANNULA) IMPLANT
VACURETTE 7MM CVD STRL WRAP (CANNULA) IMPLANT
VACURETTE 8 RIGID CVD (CANNULA) IMPLANT
VACURETTE 9 RIGID CVD (CANNULA) IMPLANT

## 2023-06-24 NOTE — Progress Notes (Signed)
Patient states she ate a [employer] sandwich at 10:00 am. Dr. Alonna Buckler and Dr. Normand Sloop made aware. Surgery canceled and will be rescheduled per Dr. Normand Sloop. Dr. Normand Sloop came to Short Stay to talk to the patient prior to the patient leaving. The office will call the patient to reschedule per Dr. Normand Sloop.

## 2023-06-25 ENCOUNTER — Other Ambulatory Visit (HOSPITAL_COMMUNITY): Payer: Self-pay | Admitting: Obstetrics and Gynecology

## 2023-06-25 DIAGNOSIS — O039 Complete or unspecified spontaneous abortion without complication: Secondary | ICD-10-CM

## 2023-06-26 ENCOUNTER — Encounter (HOSPITAL_COMMUNITY): Payer: Self-pay | Admitting: Obstetrics and Gynecology

## 2023-06-26 ENCOUNTER — Other Ambulatory Visit: Payer: Self-pay

## 2023-06-26 NOTE — Progress Notes (Signed)
PCP - No PCP Cardiologist - denies  PPM/ICD - denies Device Orders - n/a Rep Notified - n/a  Chest x-ray - denies EKG - denies Stress Test - denies ECHO - denies Cardiac Cath - denies  CPAP - denies  DM denies  Blood Thinner Instructions: denies Aspirin Instructions: denies  ERAS Protcol - clear liquids until 8:00  COVID TEST- no  Anesthesia review: no  Patient verbally denies any shortness of breath, fever, cough and chest pain during phone call   -------------  SDW INSTRUCTIONS given:  Your procedure is scheduled on June 28, 2023.  Report to [facility] Main Entrance "A" at 8:30 A.M., and check in at the Admitting office.  Call this number if you have problems the morning of surgery:  [phone]   Remember:  Do not eat after midnight the night before your surgery  You may drink clear liquids until 8:00 the morning of your surgery.   Clear liquids allowed are: Water, Non-Citrus Juices (without pulp), Carbonated Beverages, Clear Tea, Black Coffee Only, and Gatorade    Take these medicines the morning of surgery with A SIP OF WATER  N/A  As of today, STOP taking any Aspirin (unless otherwise instructed by your surgeon) Aleve, Naproxen, Ibuprofen, Motrin, Advil, Goody's, BC's, all herbal medications, fish oil, and all vitamins.                      Do not wear jewelry, make up, or nail polish            Do not wear lotions, powders, perfumes/colognes, or deodorant.            Do not shave 48 hours prior to surgery.  Men may shave face and neck.            Do not bring valuables to the hospital.            [facility] is not responsible for any belongings or valuables.  Do NOT Smoke (Tobacco/Vaping) 24 hours prior to your procedure If you use a CPAP at night, you may bring all equipment for your overnight stay.   Contacts, glasses, dentures or bridgework may not be worn into surgery.      For patients admitted to the hospital, discharge time will be  determined by your treatment team.   Patients discharged the day of surgery will not be allowed to drive home, and someone needs to stay with them for 24 hours.    Special instructions:   [facility]- Preparing For Surgery  Before surgery, you can play an important role. Because skin is not sterile, your skin needs to be as free of germs as possible. You can reduce the number of germs on your skin by washing with CHG (chlorahexidine gluconate) Soap before surgery.  CHG is an antiseptic cleaner which kills germs and bonds with the skin to continue killing germs even after washing.    Oral Hygiene is also important to reduce your risk of infection.  Remember - BRUSH YOUR TEETH THE MORNING OF SURGERY WITH YOUR REGULAR TOOTHPASTE  Please do not use if you have an allergy to CHG or antibacterial soaps. If your skin becomes reddened/irritated stop using the CHG.  Do not shave (including legs and underarms) for at least 48 hours prior to first CHG shower. It is OK to shave your face.  Please follow these instructions carefully.   Shower the NIGHT BEFORE SURGERY and the MORNING OF SURGERY with DIAL  Soap.   Pat yourself dry with a CLEAN TOWEL.  Wear CLEAN PAJAMAS to bed the night before surgery  Place CLEAN SHEETS on your bed the night of your first shower and DO NOT SLEEP WITH PETS.   Day of Surgery: Please shower morning of surgery  Wear Clean/Comfortable clothing the morning of surgery Do not apply any deodorants/lotions.   Remember to brush your teeth WITH YOUR REGULAR TOOTHPASTE.   Questions were answered. Patient verbalized understanding of instructions.

## 2023-06-28 ENCOUNTER — Ambulatory Visit (HOSPITAL_COMMUNITY): Payer: Medicaid Other | Admitting: Anesthesiology

## 2023-06-28 ENCOUNTER — Encounter (HOSPITAL_COMMUNITY): Payer: Self-pay | Admitting: Obstetrics and Gynecology

## 2023-06-28 ENCOUNTER — Other Ambulatory Visit: Payer: Self-pay

## 2023-06-28 ENCOUNTER — Ambulatory Visit (HOSPITAL_COMMUNITY)
Admission: RE | Admit: 2023-06-28 | Discharge: 2023-06-28 | Disposition: A | Discharge status: Home / Self Care | Payer: Medicaid Other | Attending: Obstetrics and Gynecology | Admitting: Obstetrics and Gynecology

## 2023-06-28 ENCOUNTER — Encounter (HOSPITAL_COMMUNITY)
Admission: RE | Disposition: A | Discharge status: Home / Self Care | Payer: Self-pay | Source: Home / Self Care | Attending: Obstetrics and Gynecology

## 2023-06-28 ENCOUNTER — Ambulatory Visit (HOSPITAL_COMMUNITY)
Admission: RE | Admit: 2023-06-28 | Discharge: 2023-06-28 | Disposition: A | Discharge status: Home / Self Care | Payer: BC Managed Care – PPO | Source: Ambulatory Visit | Attending: Obstetrics and Gynecology | Admitting: Obstetrics and Gynecology

## 2023-06-28 DIAGNOSIS — O034 Incomplete spontaneous abortion without complication: Secondary | ICD-10-CM | POA: Insufficient documentation

## 2023-06-28 DIAGNOSIS — Z3A00 Weeks of gestation of pregnancy not specified: Secondary | ICD-10-CM | POA: Diagnosis not present

## 2023-06-28 DIAGNOSIS — O039 Complete or unspecified spontaneous abortion without complication: Secondary | ICD-10-CM

## 2023-06-28 DIAGNOSIS — O30001 Twin pregnancy, unspecified number of placenta and unspecified number of amniotic sacs, first trimester: Secondary | ICD-10-CM | POA: Diagnosis not present

## 2023-06-28 HISTORY — PX: DILATION AND EVACUATION: SHX1459

## 2023-06-28 LAB — CBC
HCT: 38.9 % (ref 36.0–46.0)
Hemoglobin: 12.7 g/dL (ref 12.0–15.0)
MCH: 30.2 pg (ref 26.0–34.0)
MCHC: 32.6 g/dL (ref 30.0–36.0)
MCV: 92.4 fL (ref 80.0–100.0)
Platelets: 300 10*3/uL (ref 150–400)
RBC: 4.21 MIL/uL (ref 3.87–5.11)
RDW: 13.3 % (ref 11.5–15.5)
WBC: 7.5 10*3/uL (ref 4.0–10.5)
nRBC: 0 % (ref 0.0–0.2)

## 2023-06-28 LAB — ABO/RH: ABO/RH(D): O POS

## 2023-06-28 LAB — TYPE AND SCREEN
ABO/RH(D): O POS
Antibody Screen: NEGATIVE

## 2023-06-28 SURGERY — DILATION AND EVACUATION, UTERUS
Anesthesia: Monitor Anesthesia Care

## 2023-06-28 MED ORDER — ACETAMINOPHEN 10 MG/ML IV SOLN: 1000.0000 mg | Freq: Once | INTRAVENOUS | Status: DC | PRN

## 2023-06-28 MED ORDER — PROPOFOL 10 MG/ML IV BOLUS
INTRAVENOUS | Status: DC | PRN
  Administered 2023-06-28: 40 mg via INTRAVENOUS
  Administered 2023-06-28: 50 mg via INTRAVENOUS
  Administered 2023-06-28 (×2): 30 mg via INTRAVENOUS

## 2023-06-28 MED ORDER — ORAL CARE MOUTH RINSE: 15.0000 mL | Freq: Once | OROMUCOSAL | Status: AC

## 2023-06-28 MED ORDER — DEXAMETHASONE SODIUM PHOSPHATE 10 MG/ML IJ SOLN
INTRAMUSCULAR | Status: DC | PRN
  Administered 2023-06-28: 10 mg via INTRAVENOUS

## 2023-06-28 MED ORDER — LIDOCAINE HCL 2 % IJ SOLN
INTRAMUSCULAR | Status: DC | PRN
  Administered 2023-06-28: 10 mL

## 2023-06-28 MED ORDER — LIDOCAINE 2% (20 MG/ML) 5 ML SYRINGE
INTRAMUSCULAR | Status: DC | PRN
  Administered 2023-06-28: 40 mg via INTRAVENOUS

## 2023-06-28 MED ORDER — OXYCODONE HCL 5 MG PO TABS: 5.0000 mg | ORAL_TABLET | Freq: Once | ORAL | Status: DC | PRN

## 2023-06-28 MED ORDER — ONDANSETRON HCL 4 MG/2ML IJ SOLN
INTRAMUSCULAR | Status: DC | PRN
  Administered 2023-06-28: 4 mg via INTRAVENOUS

## 2023-06-28 MED ORDER — METHYLERGONOVINE MALEATE 0.2 MG/ML IJ SOLN
INTRAMUSCULAR | Status: AC
  Filled 2023-06-28: qty 1

## 2023-06-28 MED ORDER — DEXAMETHASONE SODIUM PHOSPHATE 10 MG/ML IJ SOLN
INTRAMUSCULAR | Status: AC
  Filled 2023-06-28: qty 1

## 2023-06-28 MED ORDER — IBUPROFEN 600 MG PO TABS: 600.0000 mg | ORAL_TABLET | Freq: Four times a day (QID) | ORAL | 1 refills | Status: DC | PRN

## 2023-06-28 MED ORDER — ACETAMINOPHEN 325 MG PO TABS: 325.0000 mg | ORAL_TABLET | ORAL | Status: DC | PRN

## 2023-06-28 MED ORDER — LACTATED RINGERS IV SOLN
INTRAVENOUS | Status: DC
Start: 2023-06-28 — End: 2023-06-28

## 2023-06-28 MED ORDER — OXYCODONE HCL 5 MG/5ML PO SOLN
5.0000 mg | Freq: Once | ORAL | Status: DC | PRN
Start: 2023-06-28 — End: 2023-06-28

## 2023-06-28 MED ORDER — LIDOCAINE HCL 1 % IJ SOLN
INTRAMUSCULAR | Status: AC
  Filled 2023-06-28: qty 20

## 2023-06-28 MED ORDER — FENTANYL CITRATE (PF) 100 MCG/2ML IJ SOLN: INTRAMUSCULAR | Status: DC | PRN

## 2023-06-28 MED ORDER — FENTANYL CITRATE (PF) 100 MCG/2ML IJ SOLN: 25.0000 ug | INTRAMUSCULAR | Status: DC | PRN

## 2023-06-28 MED ORDER — POVIDONE-IODINE 10 % EX SWAB: 2.0000 | Freq: Once | CUTANEOUS | Status: DC

## 2023-06-28 MED ORDER — SCOPOLAMINE 1 MG/3DAYS TD PT72: 1.0000 | MEDICATED_PATCH | TRANSDERMAL | Status: DC

## 2023-06-28 MED ORDER — PROPOFOL 500 MG/50ML IV EMUL
INTRAVENOUS | Status: DC | PRN
  Administered 2023-06-28: 100 ug/kg/min via INTRAVENOUS

## 2023-06-28 MED ORDER — FENTANYL CITRATE (PF) 250 MCG/5ML IJ SOLN
INTRAMUSCULAR | Status: DC | PRN
  Administered 2023-06-28: 50 ug via INTRAVENOUS

## 2023-06-28 MED ORDER — PROPOFOL 10 MG/ML IV BOLUS
INTRAVENOUS | Status: AC
  Filled 2023-06-28: qty 20

## 2023-06-28 MED ORDER — FENTANYL CITRATE (PF) 250 MCG/5ML IJ SOLN
INTRAMUSCULAR | Status: AC
  Filled 2023-06-28: qty 5

## 2023-06-28 MED ORDER — DROPERIDOL 2.5 MG/ML IJ SOLN: 0.6250 mg | Freq: Once | INTRAMUSCULAR | Status: DC | PRN

## 2023-06-28 MED ORDER — ACETAMINOPHEN 500 MG PO TABS
1000.0000 mg | ORAL_TABLET | Freq: Four times a day (QID) | ORAL | 1 refills | Status: AC | PRN
Start: 2023-06-28 — End: ?

## 2023-06-28 MED ORDER — LIDOCAINE 2% (20 MG/ML) 5 ML SYRINGE
INTRAMUSCULAR | Status: AC
  Filled 2023-06-28: qty 5

## 2023-06-28 MED ORDER — CHLORHEXIDINE GLUCONATE 0.12 % MT SOLN
15.0000 mL | Freq: Once | OROMUCOSAL | Status: AC
Start: 2023-06-28 — End: 2023-06-28

## 2023-06-28 MED ORDER — PROPOFOL 1000 MG/100ML IV EMUL
INTRAVENOUS | Status: AC
  Filled 2023-06-28: qty 100

## 2023-06-28 MED ORDER — MIDAZOLAM HCL 2 MG/2ML IJ SOLN
INTRAMUSCULAR | Status: DC | PRN
  Administered 2023-06-28: 2 mg via INTRAVENOUS

## 2023-06-28 MED ORDER — ONDANSETRON HCL 4 MG/2ML IJ SOLN
INTRAMUSCULAR | Status: AC
  Filled 2023-06-28: qty 2

## 2023-06-28 MED ORDER — LIDOCAINE-EPINEPHRINE 2 %-1:100000 IJ SOLN
INTRAMUSCULAR | Status: AC
  Filled 2023-06-28: qty 1

## 2023-06-28 MED ORDER — ACETAMINOPHEN 160 MG/5ML PO SOLN: 325.0000 mg | ORAL | Status: DC | PRN

## 2023-06-28 MED ORDER — CHLORHEXIDINE GLUCONATE 0.12 % MT SOLN
OROMUCOSAL | Status: AC
  Administered 2023-06-28: 15 mL via OROMUCOSAL
  Filled 2023-06-28: qty 15

## 2023-06-28 MED ORDER — OXYCODONE HCL 5 MG PO TABS: 5.0000 mg | ORAL_TABLET | Freq: Four times a day (QID) | ORAL | 0 refills | Status: DC | PRN

## 2023-06-28 MED ORDER — MIDAZOLAM HCL 2 MG/2ML IJ SOLN
INTRAMUSCULAR | Status: AC
  Filled 2023-06-28: qty 2

## 2023-06-28 SURGICAL SUPPLY — 20 items
CATH ROBINSON RED A/P 16FR (CATHETERS) ×1 IMPLANT
FILTER UTR ASPR ASSEMBLY (MISCELLANEOUS) ×1 IMPLANT
GLOVE BIO SURGEON STRL SZ7.5 (GLOVE) ×1 IMPLANT
GLOVE BIOGEL PI IND STRL 7.0 (GLOVE) ×1 IMPLANT
GLOVE BIOGEL PI IND STRL 7.5 (GLOVE) ×1 IMPLANT
GOWN STRL REUS W/ TWL LRG LVL3 (GOWN DISPOSABLE) ×2 IMPLANT
GOWN STRL REUS W/TWL LRG LVL3 (GOWN DISPOSABLE) ×2
HOSE CONNECTING 18IN BERKELEY (TUBING) ×1 IMPLANT
KIT BERKELEY 1ST TRIMESTER 3/8 (MISCELLANEOUS) ×2 IMPLANT
NS IRRIG 1000ML POUR BTL (IV SOLUTION) ×1 IMPLANT
PACK VAGINAL MINOR WOMEN LF (CUSTOM PROCEDURE TRAY) ×1 IMPLANT
PAD OB MATERNITY 4.3X12.25 (PERSONAL CARE ITEMS) ×1 IMPLANT
SET BERKELEY SUCTION TUBING (SUCTIONS) ×1 IMPLANT
SPIKE FLUID TRANSFER (MISCELLANEOUS) ×1 IMPLANT
TOWEL GREEN STERILE FF (TOWEL DISPOSABLE) ×2 IMPLANT
UNDERPAD 30X36 HEAVY ABSORB (UNDERPADS AND DIAPERS) ×1 IMPLANT
VACURETTE 10 RIGID CVD (CANNULA) IMPLANT
VACURETTE 7MM CVD STRL WRAP (CANNULA) IMPLANT
VACURETTE 8 RIGID CVD (CANNULA) IMPLANT
VACURETTE 9 RIGID CVD (CANNULA) IMPLANT

## 2023-06-28 NOTE — Anesthesia Preprocedure Evaluation (Addendum)
Anesthesia Evaluation  Patient identified by MRN, date of birth, ID band Patient awake    Reviewed: Allergy & Precautions, NPO status , Patient's Chart, lab work & pertinent test results  Airway Mallampati: I  TM Distance: >3 FB Neck ROM: Full    Dental  (+) Teeth Intact, Dental Advisory Given   Pulmonary Patient abstained from smoking., former smoker   breath sounds clear to auscultation       Cardiovascular negative cardio ROS  Rhythm:Regular Rate:Normal     Neuro/Psych negative neurological ROS     GI/Hepatic negative GI ROS, Neg liver ROS,,,  Endo/Other  negative endocrine ROS    Renal/GU negative Renal ROS     Musculoskeletal negative musculoskeletal ROS (+)    Abdominal   Peds  Hematology negative hematology ROS (+)   Anesthesia Other Findings   Reproductive/Obstetrics                             Anesthesia Physical Anesthesia Plan  ASA: 1  Anesthesia Plan: MAC   Post-op Pain Management: Tylenol PO (pre-op)* and Toradol IV (intra-op)*   Induction: Intravenous  PONV Risk Score and Plan: 4 or greater and Ondansetron, Dexamethasone, Midazolam, Scopolamine patch - Pre-op and Propofol infusion  Airway Management Planned:   Additional Equipment: None  Intra-op Plan:   Post-operative Plan:   Informed Consent: I have reviewed the patients History and Physical, chart, labs and discussed the procedure including the risks, benefits and alternatives for the proposed anesthesia with the patient or authorized representative who has indicated his/her understanding and acceptance.     Dental advisory given  Plan Discussed with: CRNA  Anesthesia Plan Comments:        Anesthesia Quick Evaluation

## 2023-06-28 NOTE — Anesthesia Procedure Notes (Signed)
Date/Time: 06/28/2023 11:57 AM  Performed by: Shary Decamp, CRNAPre-anesthesia Checklist: Patient identified, Emergency Drugs available, Suction available, Timeout performed and Patient being monitored Patient Re-evaluated:Patient Re-evaluated prior to induction Oxygen Delivery Method: Simple face mask

## 2023-06-28 NOTE — H&P (Signed)
Anita Levy is an 27 y.o. female. Pt with known twin pregnancy with fetal demise of both.  Pt reports no spotting or cramping.  Pertinent Gynecological History: H/o irregular cycles but comes every month and wnl  OB History: G1, P0   Menstrual History:  Patient's last menstrual period was 03/24/2023.    Past Medical History:  Diagnosis Date   HSV-1 infection     Past Surgical History:  Procedure Laterality Date   WISDOM TOOTH EXTRACTION      Family History  Problem Relation Age of Onset   Cancer Mother     Social History:  reports that she quit smoking 4 days ago. Her smoking use included cigarettes. She has never used smokeless tobacco. She reports that she does not currently use drugs after having used the following drugs: Marijuana. She reports that she does not drink alcohol.  Allergies:  Allergies  Allergen Reactions   Codeine Itching   Penicillins Swelling    Medications Prior to Admission  Medication Sig Dispense Refill Last Dose   ibuprofen (ADVIL,MOTRIN) 200 MG tablet Take 200-400 mg by mouth every 8 (eight) hours as needed (pain).   Past Week   Prenatal Vit-Fe Fumarate-FA (MULTIVITAMIN-PRENATAL) 27-0.8 MG TABS tablet Take 1 tablet by mouth in the morning.   Past Week    Review of Systems Denies F/C/N/V/D Blood pressure 122/79, pulse 89, temperature 98.5 F (36.9 C), temperature source Oral, resp. rate 20, height 5\' 7"  (1.702 m), weight 51.3 kg, last menstrual period 03/24/2023, SpO2 100%. Physical Exam Lungs unlabored CV RRR Abdomen soft, NT Extremities  Results for orders placed or performed during the hospital encounter of 06/28/23 (from the past 24 hour(s))  CBC     Status: None   Collection Time: 06/28/23  8:31 AM  Result Value Ref Range   WBC 7.5 4.0 - 10.5 K/uL   RBC 4.21 3.87 - 5.11 MIL/uL   Hemoglobin 12.7 12.0 - 15.0 g/dL   HCT 16.1 09.6 - 04.5 %   MCV 92.4 80.0 - 100.0 fL   MCH 30.2 26.0 - 34.0 pg   MCHC 32.6 30.0 - 36.0 g/dL    RDW 40.9 81.1 - 91.4 %   Platelets 300 150 - 400 K/uL   nRBC 0.0 0.0 - 0.2 %  Type and screen     Status: None   Collection Time: 06/28/23  8:31 AM  Result Value Ref Range   ABO/RH(D) O POS    Antibody Screen NEG    Sample Expiration      07/01/2023,2359 Performed at [facility], [address] [place], [place] [address]   ABO/Rh     Status: None   Collection Time: 06/28/23  8:37 AM  Result Value Ref Range   ABO/RH(D)      O POS Performed at [facility], [address], [place], [place] [address]     No results found.  Assessment/Plan: 27yo G1P0 with known fetal demise of twins.  Pt is presenting for surgical mgmt.  Risks benefits and alternatives discussed including but not limited to bleeding infection and injury.  Questions answered and consent signed and witnessed.  Purcell Nails 06/28/2023, 11:37 AM

## 2023-06-28 NOTE — Transfer of Care (Signed)
Immediate Anesthesia Transfer of Care Note  Patient: Anita Levy  Procedure(s) Performed: DILATATION AND EVACUATION  Patient Location: PACU  Anesthesia Type:MAC  Level of Consciousness: awake, alert , oriented, patient cooperative, and responds to stimulation  Airway & Oxygen Therapy: Patient Spontanous Breathing and Patient connected to face mask oxygen  Post-op Assessment: Report given to RN, Post -op Vital signs reviewed and stable, and Patient moving all extremities X 4  Post vital signs: Reviewed and stable  Last Vitals:  Vitals Value Taken Time  BP    Temp    Pulse 97 06/28/23 1256  Resp 24 06/28/23 1257  SpO2 100 % 06/28/23 1256  Vitals shown include unfiled device data.  Last Pain:  Vitals:   06/28/23 0834  TempSrc:   PainSc: 2       Patients Stated Pain Goal: 0 (06/28/23 0834)  Complications: No notable events documented.

## 2023-06-28 NOTE — Op Note (Signed)
Preop Diagnosis: MISCARRIAGE   Postop Diagnosis: MISCARRIAGE   Procedure: DILATATION AND EVACUATION OPERATIVE ULTRASOUND   Anesthesia: General   Anesthesiologist: Shelton Silvas, MD   Attending: Osborn Coho, MD   Assistant: None  Findings: Moderate POCs  Pathology: POCs  Fluids: 200 cc  UOP: 250 cc  EBL: 10 cc  Complications: None  Procedure: The patient was taken to the operating room after the risks benefits and alternatives were discussed with the patient, the patient verbalized understanding and consent signed and witnessed.  The patient was placed under MAC anesthesia, prepped and draped in the normal sterile fashion and a time out was performed.  A bivalve speculum was placed in the patient's vagina and the anterior lip of the cervix grasped with a single-tooth tenaculum. A paracervical block was administered using a total of 10 cc of 2% lidocaine.  The uterus was sounded to 11 cm and a size 10 suction curette was used. Suction curettage was performed until minimal tissue returned. Sharp curettage was performed until a gritty texture was noted. Suction curettage was performed once again to remove any remaining debris. All instruments were removed. The count was correct. The patient was transferred to the recovery room in good condition.

## 2023-06-29 NOTE — Anesthesia Postprocedure Evaluation (Signed)
Anesthesia Post Note  Patient: Anita Levy  Procedure(s) Performed: DILATATION AND EVACUATION     Patient location during evaluation: PACU Anesthesia Type: MAC Level of consciousness: awake and alert Pain management: pain level controlled Vital Signs Assessment: post-procedure vital signs reviewed and stable Respiratory status: spontaneous breathing, nonlabored ventilation, respiratory function stable and patient connected to nasal cannula oxygen Cardiovascular status: stable and blood pressure returned to baseline Postop Assessment: no apparent nausea or vomiting Anesthetic complications: no  No notable events documented.  Last Vitals:  Vitals:   06/28/23 1315 06/28/23 1330  BP: 112/83 120/82  Pulse: 83 85  Resp: 12 11  Temp:    SpO2: 100% 99%    Last Pain:  Vitals:   06/28/23 1330  TempSrc:   PainSc: 0-No pain                 Shelton Silvas

## 2023-06-30 ENCOUNTER — Encounter (HOSPITAL_COMMUNITY): Payer: Self-pay | Admitting: Obstetrics and Gynecology

## 2023-07-01 LAB — SURGICAL PATHOLOGY

## 2023-09-01 ENCOUNTER — Emergency Department (HOSPITAL_BASED_OUTPATIENT_CLINIC_OR_DEPARTMENT_OTHER): Payer: Medicaid Other

## 2023-09-01 ENCOUNTER — Other Ambulatory Visit: Payer: Self-pay

## 2023-09-01 ENCOUNTER — Emergency Department (HOSPITAL_BASED_OUTPATIENT_CLINIC_OR_DEPARTMENT_OTHER)
Admission: EM | Admit: 2023-09-01 | Discharge: 2023-09-01 | Disposition: A | Discharge status: Home / Self Care | Payer: Medicaid Other | Attending: Emergency Medicine | Admitting: Emergency Medicine

## 2023-09-01 ENCOUNTER — Encounter (HOSPITAL_BASED_OUTPATIENT_CLINIC_OR_DEPARTMENT_OTHER): Payer: Self-pay | Admitting: Emergency Medicine

## 2023-09-01 DIAGNOSIS — R0789 Other chest pain: Secondary | ICD-10-CM

## 2023-09-01 DIAGNOSIS — O26891 Other specified pregnancy related conditions, first trimester: Secondary | ICD-10-CM | POA: Insufficient documentation

## 2023-09-01 DIAGNOSIS — O99411 Diseases of the circulatory system complicating pregnancy, first trimester: Secondary | ICD-10-CM | POA: Insufficient documentation

## 2023-09-01 DIAGNOSIS — R079 Chest pain, unspecified: Secondary | ICD-10-CM | POA: Diagnosis not present

## 2023-09-01 DIAGNOSIS — R1013 Epigastric pain: Secondary | ICD-10-CM | POA: Insufficient documentation

## 2023-09-01 DIAGNOSIS — Z3A01 Less than 8 weeks gestation of pregnancy: Secondary | ICD-10-CM | POA: Diagnosis not present

## 2023-09-01 LAB — CBC
HCT: 40.6 % (ref 36.0–46.0)
Hemoglobin: 13.8 g/dL (ref 12.0–15.0)
MCH: 29.7 pg (ref 26.0–34.0)
MCHC: 34 g/dL (ref 30.0–36.0)
MCV: 87.3 fL (ref 80.0–100.0)
Platelets: 381 10*3/uL (ref 150–400)
RBC: 4.65 MIL/uL (ref 3.87–5.11)
RDW: 12.1 % (ref 11.5–15.5)
WBC: 6.5 10*3/uL (ref 4.0–10.5)
nRBC: 0 % (ref 0.0–0.2)

## 2023-09-01 LAB — BASIC METABOLIC PANEL
Anion gap: 7 (ref 5–15)
BUN: 14 mg/dL (ref 6–20)
CO2: 22 mmol/L (ref 22–32)
Calcium: 9.7 mg/dL (ref 8.9–10.3)
Chloride: 101 mmol/L (ref 98–111)
Creatinine, Ser: 0.79 mg/dL (ref 0.44–1.00)
GFR, Estimated: >60 mL/min (ref >60)
Glucose, Bld: 88 mg/dL (ref 70–99)
Potassium: 3.7 mmol/L (ref 3.5–5.1)
Sodium: 130 mmol/L — ABNORMAL LOW (ref 135–145)

## 2023-09-01 LAB — TROPONIN I (HIGH SENSITIVITY): Troponin I (High Sensitivity): <2 ng/L (ref <18)

## 2023-09-01 NOTE — ED Triage Notes (Signed)
Pt had miscarriage in November

## 2023-09-01 NOTE — ED Triage Notes (Signed)
Midsternal chest pain since Monday, constant,hurts worse with deep breaths.no respiratory symptoms

## 2023-09-01 NOTE — ED Triage Notes (Signed)
 [redacted] weeks pregnant

## 2023-09-01 NOTE — ED Notes (Signed)
Discharge paperwork given and verbally understood. 

## 2023-09-01 NOTE — ED Provider Notes (Signed)
[employer] EMERGENCY DEPARTMENT AT [facility] Provider Note   CSN: [id number] Arrival date & time: 09/01/23  4098     History  No chief complaint on file.   Anita Levy is a 28 y.o. female.  Currently approximately [redacted] weeks pregnant presenting to the ED for evaluation of chest and epigastric pain.  Symptoms began 6 days ago.  Describes a sharp sensation.  Does not radiate.  Worse after eating.  Some nausea but no vomiting or diaphoresis.  No cardiac history.  No significant family cardiac history.  Symptoms are not exertional.  No shortness of breath or cough.  No history of DVT or PE, recent cancer treatments, surgeries, long distance travels.  States she called her primary care office and was referred to the emergency department.  HPI     Home Medications Prior to Admission medications   Medication Sig Start Date End Date Taking? Authorizing Provider  acetaminophen (TYLENOL) 500 MG tablet Take 2 tablets (1,000 mg total) by mouth every 6 (six) hours as needed (breakthrough pain). 06/28/23   Osborn Coho, MD  ibuprofen (ADVIL) 600 MG tablet Take 1 tablet (600 mg total) by mouth every 6 (six) hours as needed. 06/28/23   Osborn Coho, MD  oxyCODONE (ROXICODONE) 5 MG immediate release tablet Take 1 tablet (5 mg total) by mouth every 6 (six) hours as needed for breakthrough pain or severe pain (pain score 7-10). 06/28/23   Osborn Coho, MD  Prenatal Vit-Fe Fumarate-FA (MULTIVITAMIN-PRENATAL) 27-0.8 MG TABS tablet Take 1 tablet by mouth in the morning.    [provider]      Allergies    Codeine and Penicillins    Review of Systems   Review of Systems  Cardiovascular:  Positive for chest pain.  All other systems reviewed and are negative.   Physical Exam Updated Vital Signs BP 135/81   Pulse 84   Temp 98.4 F (36.9 C) (Oral)   Resp 16   Wt 51.7 kg   LMP 03/24/2023 Comment: 4 weeks  SpO2 100%   BMI 17.85 kg/m  Physical Exam Vitals and  nursing note reviewed.  Constitutional:      General: She is not in acute distress.    Appearance: Normal appearance. She is normal weight. She is not ill-appearing.     Comments: Resting comfortably in bed  HENT:     Head: Normocephalic and atraumatic.  Cardiovascular:     Rate and Rhythm: Normal rate and regular rhythm.     Pulses: Normal pulses.  Pulmonary:     Effort: Pulmonary effort is normal. No respiratory distress.     Breath sounds: No wheezing, rhonchi or rales.  Abdominal:     General: Abdomen is flat.     Tenderness: There is no abdominal tenderness. There is no guarding.  Musculoskeletal:        General: Normal range of motion.     Cervical back: Neck supple.  Skin:    General: Skin is warm and dry.  Neurological:     Mental Status: She is alert and oriented to person, place, and time.  Psychiatric:        Mood and Affect: Mood normal.        Behavior: Behavior normal.     ED Results / Procedures / Treatments   Labs (all labs ordered are listed, but only abnormal results are displayed) Labs Reviewed  BASIC METABOLIC PANEL - Abnormal; Notable for the following components:      Result Value  Sodium 130 (*)    All other components within normal limits  CBC  TROPONIN I (HIGH SENSITIVITY)    EKG None  Radiology DG Chest Port 1 View Result Date: 09/01/2023 CLINICAL DATA:  Mid sternal chest pain over the last 6 days. Worse with deep inspiration. EXAM: PORTABLE CHEST 1 VIEW COMPARISON:  05/07/2016 FINDINGS: Overlying artifact. One could question mild central bronchial thickening. There is no infiltrate, collapse or effusion. No abnormal bone finding. No pneumothorax. IMPRESSION: Overlying artifact. One could question mild central bronchial thickening. No consolidation, collapse or effusion. Electronically Signed   By: Paulina Fusi M.D.   On: 09/01/2023 10:39    Procedures Procedures    Medications Ordered in ED Medications - No data to display  ED  Course/ Medical Decision Making/ A&P                                 Medical Decision Making This patient presents to the ED for concern of chest pain, this involves an extensive number of treatment options, and is a complaint that carries with it a high risk of complications and morbidity. The emergent differential diagnosis of chest pain includes: Acute coronary syndrome, pericarditis, aortic dissection, pulmonary embolism, tension pneumothorax, and esophageal rupture.  I do not believe the patient has an emergent cause of chest pain, other urgent/non-acute considerations include, but are not limited to: chronic angina, aortic stenosis, cardiomyopathy, myocarditis, mitral valve prolapse, pulmonary hypertension, hypertrophic obstructive cardiomyopathy (HOCM), aortic insufficiency, right ventricular hypertrophy, pneumonia, pleuritis, bronchitis, pneumothorax, tumor, gastroesophageal reflux disease (GERD), esophageal spasm, Mallory-Weiss syndrome, peptic ulcer disease, biliary disease, pancreatitis, functional gastrointestinal pain, cervical or thoracic disk disease or arthritis, shoulder arthritis, costochondritis, subacromial bursitis, anxiety or panic attack, herpes zoster, breast disorders, chest wall tumors, thoracic outlet syndrome, mediastinitis.  My initial workup includes ACS rule out  Additional history obtained from: Nursing notes from this visit.  I ordered, reviewed and interpreted labs which include: BMP, CBC, troponin  I ordered imaging studies including chest x-ray I independently visualized and interpreted imaging which showed overall normal.  Does question mild central bronchial thickening.  Does not clinically appear to be present. I agree with the radiologist interpretation  Cardiac Monitoring:  The patient was maintained on a cardiac monitor.  I personally viewed and interpreted the cardiac monitored which showed an underlying rhythm of: NSR  Afebrile, hemodynamically  stable.  28 year old female presenting to the ED for evaluation of chest pain.  This appears to be more in the epigastric/lower sternal region.  Worse after eating.  She is currently [redacted] weeks pregnant.  She appears well on physical exam.  Very little concern for PE.  Initial troponin negative.  EKG without acute ischemic changes.  Very low suspicion for ACS.  Overall suspect GERD which is common in this stage of pregnancy.  She was encouraged to follow-up with her primary care provider for continued management.  She was given return precautions.  Stable at discharge.  At this time there does not appear to be any evidence of an acute emergency medical condition and the patient appears stable for discharge with appropriate outpatient follow up. Diagnosis was discussed with patient who verbalizes understanding of care plan and is agreeable to discharge. I have discussed return precautions with patient who verbalizes understanding. Patient encouraged to follow-up with their PCP within 1 week. All questions answered.  Patient's case discussed with Dr. Andria Meuse who agrees with plan to  discharge with follow-up.   Note: Portions of this report may have been transcribed using voice recognition software. Every effort was made to ensure accuracy; however, inadvertent computerized transcription errors may still be present.         Final Clinical Impression(s) / ED Diagnoses Final diagnoses:  Atypical chest pain    Rx / DC Orders ED Discharge Orders     None         Michelle Piper, PA-C 09/01/23 1141    Anders Simmonds T, DO 09/04/23 [phone]

## 2023-09-01 NOTE — Discharge Instructions (Signed)
You have been seen today for your complaint of chest pain. Your lab work was reassuring.  Did show a low sodium level.  This can be replenished through your diet. Your imaging was overall very reassuring.  Did question potential infection, however this does not clinically appear to be what is causing your symptoms. Follow up with: Your primary care provider Please seek immediate medical care if you develop any of the following symptoms: Your chest pain is worse. You have a cough that gets worse, or you cough up blood. You have very bad (severe) pain in your belly (abdomen). You pass out (faint). You have either of these for no clear reason: Sudden chest discomfort. Sudden discomfort in your arms, back, neck, or jaw. You have shortness of breath at any time. You suddenly start to sweat, or your skin gets clammy. You feel sick to your stomach (nauseous). You throw up (vomit). You suddenly feel lightheaded or dizzy. You feel very weak or tired. Your heart starts to beat fast, or it feels like it is skipping beats. At this time there does not appear to be the presence of an emergent medical condition, however there is always the potential for conditions to change. Please read and follow the below instructions.  Do not take your medicine if  develop an itchy rash, swelling in your mouth or lips, or difficulty breathing; call 911 and seek immediate emergency medical attention if this occurs.  You may review your lab tests and imaging results in their entirety on your MyChart account.  Please discuss all results of fully with your primary care provider and other specialist at your follow-up visit.  Note: Portions of this text may have been transcribed using voice recognition software. Every effort was made to ensure accuracy; however, inadvertent computerized transcription errors may still be present.

## 2023-09-07 ENCOUNTER — Inpatient Hospital Stay (HOSPITAL_COMMUNITY)
Admission: AD | Admit: 2023-09-07 | Discharge: 2023-09-08 | Disposition: A | Discharge status: Home / Self Care | Payer: Medicaid Other | Attending: Obstetrics and Gynecology | Admitting: Obstetrics and Gynecology

## 2023-09-07 DIAGNOSIS — O26891 Other specified pregnancy related conditions, first trimester: Secondary | ICD-10-CM | POA: Insufficient documentation

## 2023-09-07 DIAGNOSIS — Z3A01 Less than 8 weeks gestation of pregnancy: Secondary | ICD-10-CM | POA: Insufficient documentation

## 2023-09-07 DIAGNOSIS — R109 Unspecified abdominal pain: Secondary | ICD-10-CM | POA: Insufficient documentation

## 2023-09-08 ENCOUNTER — Encounter (HOSPITAL_COMMUNITY): Payer: Self-pay

## 2023-09-08 ENCOUNTER — Inpatient Hospital Stay (HOSPITAL_COMMUNITY): Payer: Medicaid Other

## 2023-09-08 DIAGNOSIS — R109 Unspecified abdominal pain: Secondary | ICD-10-CM

## 2023-09-08 DIAGNOSIS — O26891 Other specified pregnancy related conditions, first trimester: Secondary | ICD-10-CM | POA: Diagnosis present

## 2023-09-08 DIAGNOSIS — Z3A01 Less than 8 weeks gestation of pregnancy: Secondary | ICD-10-CM | POA: Diagnosis not present

## 2023-09-08 LAB — POCT PREGNANCY, URINE: Preg Test, Ur: POSITIVE — AB

## 2023-09-08 LAB — URINALYSIS, ROUTINE W REFLEX MICROSCOPIC
Bilirubin Urine: NEGATIVE
Glucose, UA: NEGATIVE mg/dL
Hgb urine dipstick: NEGATIVE
Ketones, ur: NEGATIVE mg/dL
Nitrite: NEGATIVE
Protein, ur: NEGATIVE mg/dL
Specific Gravity, Urine: 1.014 (ref 1.005–1.030)
pH: 5 (ref 5.0–8.0)

## 2023-09-08 LAB — COMPREHENSIVE METABOLIC PANEL
ALT: 12 U/L (ref 0–44)
AST: 15 U/L (ref 15–41)
Albumin: 4.1 g/dL (ref 3.5–5.0)
Alkaline Phosphatase: 48 U/L (ref 38–126)
Anion gap: 8 (ref 5–15)
BUN: 6 mg/dL (ref 6–20)
CO2: 20 mmol/L — ABNORMAL LOW (ref 22–32)
Calcium: 9.2 mg/dL (ref 8.9–10.3)
Chloride: 107 mmol/L (ref 98–111)
Creatinine, Ser: 0.68 mg/dL (ref 0.44–1.00)
GFR, Estimated: >60 mL/min (ref >60)
Glucose, Bld: 99 mg/dL (ref 70–99)
Potassium: 3.8 mmol/L (ref 3.5–5.1)
Sodium: 135 mmol/L (ref 135–145)
Total Bilirubin: 0.4 mg/dL (ref 0.0–1.2)
Total Protein: 7.2 g/dL (ref 6.5–8.1)

## 2023-09-08 LAB — CBC
HCT: 35.7 % — ABNORMAL LOW (ref 36.0–46.0)
Hemoglobin: 12.2 g/dL (ref 12.0–15.0)
MCH: 30.1 pg (ref 26.0–34.0)
MCHC: 34.2 g/dL (ref 30.0–36.0)
MCV: 88.1 fL (ref 80.0–100.0)
Platelets: 384 10*3/uL (ref 150–400)
RBC: 4.05 MIL/uL (ref 3.87–5.11)
RDW: 12.1 % (ref 11.5–15.5)
WBC: 6.6 10*3/uL (ref 4.0–10.5)
nRBC: 0 % (ref 0.0–0.2)

## 2023-09-08 LAB — WET PREP, GENITAL
Sperm: NONE SEEN
Trich, Wet Prep: NONE SEEN
WBC, Wet Prep HPF POC: <10 (ref <10)
Yeast Wet Prep HPF POC: NONE SEEN

## 2023-09-08 LAB — HCG, QUANTITATIVE, PREGNANCY: hCG, Beta Chain, Quant, S: 1676 m[IU]/mL — ABNORMAL HIGH (ref <5)

## 2023-09-08 MED ORDER — METRONIDAZOLE 500 MG PO TABS: 500.0000 mg | ORAL_TABLET | Freq: Two times a day (BID) | ORAL | 0 refills | Status: DC

## 2023-09-08 NOTE — MAU Provider Note (Signed)
History     CSN: [id number]  Arrival date and time: 09/07/23 2318   Event Date/Time   First Provider Initiated Contact with Patient 09/08/23 0015      Chief Complaint  Patient presents with   Abdominal Pain   Anita Levy is a 28 y.o. G2P0010 at [redacted]w[redacted]d who receives care at [facility]. Patient reports she has an [place] appt scheduled for Feb 19th.  She presents today for abdominal pain. Patient states the pain is intermittent and has been present since 4pm.  She denies relieving or worsening factors. Patient rates the pain a 6/10. No issues with urination, constipation, or diarrhea.    OB History     Gravida  2   Para      Term      Preterm      AB  1   Living         SAB  1   IAB      Ectopic      Multiple      Live Births              Past Medical History:  Diagnosis Date   HSV-1 infection     Past Surgical History:  Procedure Laterality Date   DILATION AND EVACUATION N/A 06/28/2023   Procedure: DILATATION AND EVACUATION;  Surgeon: Osborn Coho, MD;  [facility];  Service: Gynecology;  Laterality: N/A;   WISDOM TOOTH EXTRACTION      Family History  Problem Relation Age of Onset   Cancer Mother     Social History   Tobacco Use   Smoking status: Former    Current packs/day: 0.00    Types: Cigarettes    Quit date: 06/24/2023    Years since quitting: 0.2   Smokeless tobacco: Never  Vaping Use   Vaping status: Never Used  Substance Use Topics   Alcohol use: No    Alcohol/week: 0.0 standard drinks of alcohol   Drug use: Not Currently    Types: Marijuana    Comment: Last use was prior to pregnancy 12 wks ago    Allergies:  Allergies  Allergen Reactions   Codeine Itching   Penicillins Swelling    Medications Prior to Admission  Medication Sig Dispense Refill Last Dose/Taking   acetaminophen (TYLENOL) 500 MG tablet Take 2 tablets (1,000 mg total) by mouth every 6 (six) hours as needed (breakthrough pain). 40 tablet 1    ibuprofen  (ADVIL) 600 MG tablet Take 1 tablet (600 mg total) by mouth every 6 (six) hours as needed. 40 tablet 1    oxyCODONE (ROXICODONE) 5 MG immediate release tablet Take 1 tablet (5 mg total) by mouth every 6 (six) hours as needed for breakthrough pain or severe pain (pain score 7-10). 5 tablet 0    Prenatal Vit-Fe Fumarate-FA (MULTIVITAMIN-PRENATAL) 27-0.8 MG TABS tablet Take 1 tablet by mouth in the morning.       Review of Systems  Gastrointestinal:  Positive for abdominal pain (Cramping). Negative for constipation and diarrhea.  Genitourinary:  Negative for difficulty urinating, dysuria, vaginal bleeding and vaginal discharge.   Physical Exam   Blood pressure 119/85, pulse 83, temperature 97.9 F (36.6 C), temperature source Oral, resp. rate 16, height 5\' 7"  (1.702 m), weight 57 kg, last menstrual period 03/24/2023, SpO2 99%.  Physical Exam Vitals and nursing note reviewed.  Constitutional:      Appearance: She is well-developed.  HENT:     Head: Normocephalic and atraumatic.  Eyes:  Conjunctiva/sclera: Conjunctivae normal.  Cardiovascular:     Rate and Rhythm: Normal rate.  Pulmonary:     Effort: Pulmonary effort is normal. No respiratory distress.  Musculoskeletal:        General: Normal range of motion.     Cervical back: Normal range of motion.  Neurological:     Mental Status: She is alert and oriented to person, place, and time.  Psychiatric:        Mood and Affect: Mood normal.        Behavior: Behavior normal.     MAU Course  Procedures Results for orders placed or performed during the hospital encounter of 09/07/23 (from the past 24 hours)  Pregnancy, urine POC     Status: Abnormal   Collection Time: 09/08/23 12:13 AM  Result Value Ref Range   Preg Test, Ur POSITIVE (A) NEGATIVE  Urinalysis, Routine w reflex microscopic -Urine, Clean Catch     Status: Abnormal   Collection Time: 09/08/23 12:16 AM  Result Value Ref Range   Color, Urine YELLOW YELLOW    APPearance HAZY (A) CLEAR   Specific Gravity, Urine 1.014 1.005 - 1.030   pH 5.0 5.0 - 8.0   Glucose, UA NEGATIVE NEGATIVE mg/dL   Hgb urine dipstick NEGATIVE NEGATIVE   Bilirubin Urine NEGATIVE NEGATIVE   Ketones, ur NEGATIVE NEGATIVE mg/dL   Protein, ur NEGATIVE NEGATIVE mg/dL   Nitrite NEGATIVE NEGATIVE   Leukocytes,Ua TRACE (A) NEGATIVE   RBC / HPF 0-5 0 - 5 RBC/hpf   WBC, UA 0-5 0 - 5 WBC/hpf   Bacteria, UA RARE (A) NONE SEEN   Squamous Epithelial / HPF 6-10 0 - 5 /HPF   Mucus PRESENT   CBC     Status: Abnormal   Collection Time: 09/08/23 12:28 AM  Result Value Ref Range   WBC 6.6 4.0 - 10.5 K/uL   RBC 4.05 3.87 - 5.11 MIL/uL   Hemoglobin 12.2 12.0 - 15.0 g/dL   HCT 62.1 (L) 30.8 - 65.7 %   MCV 88.1 80.0 - 100.0 fL   MCH 30.1 26.0 - 34.0 pg   MCHC 34.2 30.0 - 36.0 g/dL   RDW 84.6 96.2 - 95.2 %   Platelets 384 150 - 400 K/uL   nRBC 0.0 0.0 - 0.2 %  hCG, quantitative, pregnancy     Status: Abnormal   Collection Time: 09/08/23 12:28 AM  Result Value Ref Range   hCG, Beta Chain, Quant, S 1,676 (H) <5 mIU/mL  Comprehensive metabolic panel     Status: Abnormal   Collection Time: 09/08/23 12:28 AM  Result Value Ref Range   Sodium 135 135 - 145 mmol/L   Potassium 3.8 3.5 - 5.1 mmol/L   Chloride 107 98 - 111 mmol/L   CO2 20 (L) 22 - 32 mmol/L   Glucose, Bld 99 70 - 99 mg/dL   BUN 6 6 - 20 mg/dL   Creatinine, Ser 8.41 0.44 - 1.00 mg/dL   Calcium 9.2 8.9 - 32.4 mg/dL   Total Protein 7.2 6.5 - 8.1 g/dL   Albumin 4.1 3.5 - 5.0 g/dL   AST 15 15 - 41 U/L   ALT 12 0 - 44 U/L   Alkaline Phosphatase 48 38 - 126 U/L   Total Bilirubin 0.4 0.0 - 1.2 mg/dL   GFR, Estimated >40 >10 mL/min   Anion gap 8 5 - 15  Wet prep, genital     Status: Abnormal   Collection Time: 09/08/23  1:08 AM  Result Value Ref Range   Yeast Wet Prep HPF POC NONE SEEN NONE SEEN   Trich, Wet Prep NONE SEEN NONE SEEN   Clue Cells Wet Prep HPF POC PRESENT (A) NONE SEEN   WBC, Wet Prep HPF POC <10 <10    Sperm NONE SEEN    US OB LESS THAN 14 WEEKS WITH OB TRANSVAGINAL Result Date: 09/08/2023 CLINICAL DATA:  Initial evaluation for early pregnancy, abdominal pain. EXAM: OBSTETRIC <14 WK [place] AND TRANSVAGINAL OB US TECHNIQUE: Both transabdominal and transvaginal ultrasound examinations were performed for complete evaluation of the gestation as well as the maternal uterus, adnexal regions, and pelvic cul-de-sac. Transvaginal technique was performed to assess early pregnancy. COMPARISON:  None Available. FINDINGS: Intrauterine gestational sac: Single Yolk sac:  Negative Embryo:  Negative Cardiac Activity: Negative Heart Rate: N/A MSD: 3.2 mm   5 w   0 d Subchorionic hemorrhage:  None visualized. Maternal uterus/adnexae: Right ovary within normal limits. Approximate 1.3 cm degenerating left ovarian corpus luteal cyst. No other adnexal mass or free fluid. IMPRESSION: 1. Probable early intrauterine gestational sac, but no yolk sac, fetal pole, or cardiac activity yet visualized. Recommend follow-up quantitative B-HCG levels and follow-up US in 14 days to confirm and assess viability. This recommendation follows SRU consensus guidelines: Diagnostic Criteria for Nonviable Pregnancy Early in the First Trimester. Malva Limes Med 2013; 829:5621-30. 2. 1.3 cm left ovarian corpus luteal cyst. Electronically Signed   By: Rise Mu M.D.   On: 09/08/2023 01:34    MDM Physical Exam Cultures: Wet Prep and GC/CT Labs: UA, UPT, CBC, CMP, hCG, ABO Ultrasound Prescription Assessment and Plan  28 year old G2P0 at 5.4 weeks Abdominal Cramping  -POC Reviewed -Assessment completed. -Patient offered and declines pain medication. -Labs ordered. -Send for [place] and await results.   Cherre Robins 09/08/2023, 12:16 AM   Reassessment (2:05 AM) -Results as above. -Informed of need for follow up. -Patient scheduled for appt on Feb 19th at primary ob. -Discussed wet prep findings and recommendation for treatment of BV.   -Patient agreeable and desires oral medication. Rx sent to pharmacy on file.  -Precautions reviewed. -Encouraged to call primary office or return to MAU if symptoms worsen or with the onset of new symptoms. -Discharged to home in stable condition.  Cherre Robins MSN, CNM Advanced Practice Provider, Center for [employer]

## 2023-09-08 NOTE — MAU Note (Signed)
.  Anita Levy is a 28 y.o. at Unknown here in MAU reporting: sharp lower abdominal cramping. Hx of miscarriage so wants to make sure everything is ok. Schedule for first prenatal appt on 2/19. Denies VB or abnormal discharge. Denies taking medication for pain.   LMP: 07/31/2023 Onset of complaint: 1600 Pain score: 6 Vitals:   09/08/23 0009  BP: 119/85  Pulse: 83  Resp: 16  Temp: 97.9 F (36.6 C)  SpO2: 99%     FHT: NA   Lab orders placed from triage: UA; urine pregnancy

## 2023-09-09 LAB — GC/CHLAMYDIA PROBE AMP (~~LOC~~) NOT AT ARMC
Chlamydia: NEGATIVE
Comment: NEGATIVE
Comment: NORMAL
Neisseria Gonorrhea: NEGATIVE

## 2023-09-20 ENCOUNTER — Inpatient Hospital Stay (HOSPITAL_COMMUNITY)
Admission: AD | Admit: 2023-09-20 | Discharge: 2023-09-20 | Disposition: A | Discharge status: Home / Self Care | Payer: Medicaid Other | Attending: Obstetrics and Gynecology | Admitting: Obstetrics and Gynecology

## 2023-09-20 ENCOUNTER — Inpatient Hospital Stay (HOSPITAL_COMMUNITY): Payer: Medicaid Other

## 2023-09-20 DIAGNOSIS — Z3A01 Less than 8 weeks gestation of pregnancy: Secondary | ICD-10-CM | POA: Insufficient documentation

## 2023-09-20 DIAGNOSIS — O26891 Other specified pregnancy related conditions, first trimester: Secondary | ICD-10-CM | POA: Insufficient documentation

## 2023-09-20 DIAGNOSIS — O209 Hemorrhage in early pregnancy, unspecified: Secondary | ICD-10-CM | POA: Insufficient documentation

## 2023-09-20 NOTE — MAU Note (Signed)
 Pt says she was fixing hair today at 745pm-  Went to b-room- saw pinkish red blood in toilet and when she wiped - saw some. No cramps . Pad in Triage- scant amt light red. [facility]- [facility]- called office - told not to come yet-  Then called again - told to come in.  U/S sch for 10-02-2023

## 2023-09-20 NOTE — MAU Provider Note (Signed)
 History     CSN: [id number]  Arrival date and time: 09/20/23 2008   Event Date/Time   First Provider Initiated Contact with Patient 09/20/23 2132      Chief Complaint  Patient presents with   Vaginal Bleeding    Anita Levy is a 28 y.o. G2P0010 at [redacted]w[redacted]d who receives care at [facility].  She presents today for vaginal bleeding.  Patient states she noted bleeding around 1945 that was pinkish red in color with wiping.  She states she noted some continued light spotting upon arrival. Patient denies cramping.  No recent sexual activity.    OB History     Gravida  2   Para      Term      Preterm      AB  1   Living         SAB  1   IAB      Ectopic      Multiple      Live Births              Past Medical History:  Diagnosis Date   HSV-1 infection     Past Surgical History:  Procedure Laterality Date   DILATION AND EVACUATION N/A 06/28/2023   Procedure: DILATATION AND EVACUATION;  Surgeon: Henry Slough, MD;  Location: [facility] OR;  Service: Gynecology;  Laterality: N/A;   WISDOM TOOTH EXTRACTION      Family History  Problem Relation Age of Onset   Cancer Mother     Social History   Tobacco Use   Smoking status: Former    Current packs/day: 0.00    Types: Cigarettes    Quit date: 06/24/2023    Years since quitting: 0.2   Smokeless tobacco: Never  Vaping Use   Vaping status: Never Used  Substance Use Topics   Alcohol use: No    Alcohol/week: 0.0 standard drinks of alcohol   Drug use: Not Currently    Types: Marijuana    Comment: Last use was prior to pregnancy 12 wks ago    Allergies:  Allergies  Allergen Reactions   Codeine Itching   Penicillins Swelling    Medications Prior to Admission  Medication Sig Dispense Refill Last Dose/Taking   acetaminophen  (TYLENOL ) 500 MG tablet Take 2 tablets (1,000 mg total) by mouth every 6 (six) hours as needed (breakthrough pain). 40 tablet 1    metroNIDAZOLE  (FLAGYL ) 500 MG tablet Take 1 tablet (500  mg total) by mouth 2 (two) times daily. 14 tablet 0    Prenatal Vit-Fe Fumarate-FA (MULTIVITAMIN-PRENATAL) 27-0.8 MG TABS tablet Take 1 tablet by mouth in the morning.       Review of Systems  Gastrointestinal:  Negative for abdominal pain, nausea and vomiting.  Genitourinary:  Positive for vaginal bleeding. Negative for difficulty urinating, dysuria and vaginal discharge.   Physical Exam   Blood pressure 114/68, pulse 77, temperature 98 F (36.7 C), temperature source Oral, resp. rate 14, height 5' 7 (1.702 m), weight 55.2 kg, last menstrual period 07/31/2023.  Physical Exam Vitals reviewed.  Constitutional:      Appearance: Normal appearance.  HENT:     Head: Normocephalic and atraumatic.  Eyes:     Conjunctiva/sclera: Conjunctivae normal.  Cardiovascular:     Rate and Rhythm: Normal rate.  Pulmonary:     Effort: Pulmonary effort is normal.  Musculoskeletal:        General: Normal range of motion.     Cervical back: Normal range of motion.  Skin:    General: Skin is warm and dry.  Neurological:     Mental Status: She is alert and oriented to person, place, and time.  Psychiatric:        Mood and Affect: Mood normal.        Behavior: Behavior normal.     MAU Course  Procedures  US  OB Transvaginal Result Date: 09/20/2023 CLINICAL DATA:  Vaginal bleeding, spotting EXAM: TRANSVAGINAL OB ULTRASOUND TECHNIQUE: Transvaginal ultrasound was performed for complete evaluation of the gestation as well as the maternal uterus, adnexal regions, and pelvic cul-de-sac. COMPARISON:  09/08/2023 FINDINGS: Intrauterine gestational sac: Single Yolk sac:  Visualized. Embryo:  Visualized. Cardiac Activity: Visualized. Heart Rate: 131 bpm CRL:   7.8 mm   6 w 5 d                  US  EDC: 05/10/2024 Subchorionic hemorrhage:  None visualized. Maternal uterus/adnexae: No adnexal masses. Trace pelvic free fluid. IMPRESSION: 1. Single live intrauterine pregnancy as above, estimated age 24 weeks and 5  days. No evidence of complication. Electronically Signed   By: Ozell Daring M.D.   On: 09/20/2023 21:14    MDM Ultrasound Assessment and Plan  28 year old, G2P0010  SIUP at 7.2 weeks Vaginal Bleeding  -Patient sent to US  from triage. -Results as above.  -Provider to bedside to review findings. -Reassured that SIUP with HR. -Bleeding precautions reviewed. -Encouraged pelvic rest while bleeding and for at least 3 days upon resolution.  -Patient without questions. -Instructed to keep appt as scheduled. -Encouraged to call primary office or return to MAU if symptoms worsen or with the onset of new symptoms. -Discharged to home in stable condition.  Harlene LITTIE Duncans MSN, CNM Advanced Practice Provider, Center for [facility] 09/20/2023, 9:32 PM

## 2023-09-30 ENCOUNTER — Other Ambulatory Visit: Payer: Self-pay

## 2023-09-30 ENCOUNTER — Inpatient Hospital Stay (HOSPITAL_COMMUNITY)
Admission: AD | Admit: 2023-09-30 | Discharge: 2023-09-30 | Disposition: A | Discharge status: Home / Self Care | Payer: Medicaid Other | Attending: Obstetrics & Gynecology | Admitting: Obstetrics & Gynecology

## 2023-09-30 ENCOUNTER — Inpatient Hospital Stay (HOSPITAL_COMMUNITY): Payer: Medicaid Other

## 2023-09-30 ENCOUNTER — Encounter (HOSPITAL_COMMUNITY): Payer: Self-pay | Admitting: Obstetrics & Gynecology

## 2023-09-30 DIAGNOSIS — R109 Unspecified abdominal pain: Secondary | ICD-10-CM | POA: Diagnosis present

## 2023-09-30 DIAGNOSIS — O208 Other hemorrhage in early pregnancy: Secondary | ICD-10-CM | POA: Diagnosis not present

## 2023-09-30 DIAGNOSIS — Z87891 Personal history of nicotine dependence: Secondary | ICD-10-CM | POA: Insufficient documentation

## 2023-09-30 DIAGNOSIS — Z3A08 8 weeks gestation of pregnancy: Secondary | ICD-10-CM | POA: Insufficient documentation

## 2023-09-30 LAB — URINALYSIS, ROUTINE W REFLEX MICROSCOPIC
Bilirubin Urine: NEGATIVE
Glucose, UA: NEGATIVE mg/dL
Ketones, ur: NEGATIVE mg/dL
Nitrite: NEGATIVE
Protein, ur: NEGATIVE mg/dL
Specific Gravity, Urine: 1.018 (ref 1.005–1.030)
pH: 5 (ref 5.0–8.0)

## 2023-09-30 NOTE — MAU Note (Signed)
Anita Levy is a 28 y.o. at [redacted]w[redacted]d here in MAU reporting: she began having abdominal cramping yesterday and VB today.  Reports VB is spotting with wiping, denies recent intercourse.  LMP: NA Onset of complaint: yesterday Pain score: 5 Vitals:   09/30/23 0715  BP: 105/74  Pulse: 85  Resp: 18  Temp: 98.1 F (36.7 C)  SpO2: 100%     FHT: NA  Lab orders placed from triage: UA

## 2023-09-30 NOTE — MAU Provider Note (Signed)
History     CSN: [id number]  Arrival date and time: 09/30/23 9147   Event Date/Time   First Provider Initiated Contact with Patient 09/30/23 [phone]      Chief Complaint  Patient presents with   Abdominal Pain   Vaginal Bleeding   HPI [name] is a 28 y.o. year old G46P0010 female at [redacted]w[redacted]d weeks gestation who presents to MAU reporting Abdominal cramping since yesterday and vaginal bleeding that started today.  She rates the pain 5/10.  She describes the vaginal bleeding as spotting only with wiping.  She denies any recent sexual intercourse. She plans to receive [facility] with [facility]; next appt is 10/02/2023. Her significant other is present and contributing to the history taking.  OB History     Gravida  2   Para      Term      Preterm      AB  1   Living         SAB  1   IAB      Ectopic      Multiple      Live Births              Past Medical History:  Diagnosis Date   HSV-1 infection     Past Surgical History:  Procedure Laterality Date   DILATION AND EVACUATION N/A 06/28/2023   Procedure: DILATATION AND EVACUATION;  Surgeon: Osborn Coho, MD;  Location: [facility] OR;  Service: Gynecology;  Laterality: N/A;   WISDOM TOOTH EXTRACTION      Family History  Problem Relation Age of Onset   Cancer Mother     Social History   Tobacco Use   Smoking status: Former    Current packs/day: 0.00    Types: Cigarettes    Quit date: 06/24/2023    Years since quitting: 0.2   Smokeless tobacco: Never  Vaping Use   Vaping status: Never Used  Substance Use Topics   Alcohol use: No    Alcohol/week: 0.0 standard drinks of alcohol   Drug use: Not Currently    Types: Marijuana    Comment: Last use was prior to pregnancy 12 wks ago    Allergies:  Allergies  Allergen Reactions   Codeine Itching   Penicillins Swelling    Medications Prior to Admission  Medication Sig Dispense Refill Last Dose/Taking   acetaminophen (TYLENOL) 500 MG  tablet Take 2 tablets (1,000 mg total) by mouth every 6 (six) hours as needed (breakthrough pain). 40 tablet 1    metroNIDAZOLE (FLAGYL) 500 MG tablet Take 1 tablet (500 mg total) by mouth 2 (two) times daily. 14 tablet 0    Prenatal Vit-Fe Fumarate-FA (MULTIVITAMIN-PRENATAL) 27-0.8 MG TABS tablet Take 1 tablet by mouth in the morning.       Review of Systems  Constitutional: Negative.   HENT: Negative.    Eyes: Negative.   Respiratory: Negative.    Cardiovascular: Negative.   Gastrointestinal: Negative.   Endocrine: Negative.   Genitourinary:  Positive for pelvic pain (cramping; rated 5/10) and vaginal bleeding (spotting with wiping only).  Musculoskeletal: Negative.   Skin: Negative.   Allergic/Immunologic: Negative.   Neurological: Negative.   Hematological: Negative.   Psychiatric/Behavioral: Negative.     Physical Exam   Blood pressure 105/74, pulse 85, temperature 98.1 F (36.7 C), temperature source Oral, resp. rate 18, height 5\' 7"  (1.702 m), weight 56.2 kg, last menstrual period 07/31/2023, SpO2 100%.  Physical Exam Vitals and nursing note  reviewed.  Constitutional:      Appearance: Normal appearance. She is normal weight.  Pulmonary:     Effort: Pulmonary effort is normal.  Genitourinary:    Comments: deferred Musculoskeletal:        General: Normal range of motion.  Skin:    General: Skin is warm and dry.  Neurological:     Mental Status: She is alert and oriented to person, place, and time.  Psychiatric:        Mood and Affect: Mood normal.        Behavior: Behavior normal.        Thought Content: Thought content normal.        Judgment: Judgment normal.    MAU Course  Procedures  MDM CCUA Results for orders placed or performed during the hospital encounter of 09/30/23 (from the past 24 hours)  Urinalysis, Routine w reflex microscopic -Urine, Clean Catch     Status: Abnormal   Collection Time: 09/30/23  7:18 AM  Result Value Ref Range   Color, Urine  YELLOW YELLOW   APPearance CLOUDY (A) CLEAR   Specific Gravity, Urine 1.018 1.005 - 1.030   pH 5.0 5.0 - 8.0   Glucose, UA NEGATIVE NEGATIVE mg/dL   Hgb urine dipstick LARGE (A) NEGATIVE   Bilirubin Urine NEGATIVE NEGATIVE   Ketones, ur NEGATIVE NEGATIVE mg/dL   Protein, ur NEGATIVE NEGATIVE mg/dL   Nitrite NEGATIVE NEGATIVE   Leukocytes,Ua TRACE (A) NEGATIVE   RBC / HPF 11-20 0 - 5 RBC/hpf   WBC, UA 11-20 0 - 5 WBC/hpf   Bacteria, UA RARE (A) NONE SEEN   Squamous Epithelial / HPF 6-10 0 - 5 /HPF   Mucus PRESENT     US OB Comp Less 14 Wks Result Date: 09/30/2023 CLINICAL DATA:  Abdominal cramping and vaginal bleeding. Beta hCG is not available at the time of interpretation. EXAM: OBSTETRIC <14 WK ULTRASOUND TECHNIQUE: Transabdominal ultrasound was performed for evaluation of the gestation as well as the maternal uterus and adnexal regions. COMPARISON:  Obstetric ultrasound dated 09/20/2023 FINDINGS: Intrauterine gestational sac: Single Yolk sac:  Visualized. Embryo:  Visualized. Cardiac Activity: Visualized. Heart Rate: 159 bpm CRL:   16.1 mm   8 w 0 d                  [place] EDC: 05/11/2024 Subchorionic hemorrhage: Moderate subchorionic hemorrhage measures 2.3 x 1.2 x 0.5 cm. Maternal uterus/adnexae: Ovaries are not seen. IMPRESSION: 1. Single live intrauterine pregnancy measures 8 weeks 0 days gestation by crown-rump length. 2. Moderate subchorionic hemorrhage. Electronically Signed   By: Agustin Cree M.D.   On: 09/30/2023 09:18   Assessment and Plan  1. Spotting and cramping affecting pregnancy, antepartum (Primary) - Information provided on vaginal bleeding in pregnancy and abdominal pain in pregnancy   2. [redacted] weeks gestation of pregnancy   - Discharge patient - Keep scheduled appt with [facility] on 10/02/2023 - Patient verbalized an understanding of the plan of care and agrees.    Anita Levy, CNM 09/30/2023, 9:49 AM

## 2023-09-30 NOTE — Discharge Instructions (Signed)
Safe Medications in Pregnancy   Acne: Benzoyl Peroxide Salicylic Acid  Backache/Headache: Tylenol: 2 regular strength every 4 hours OR              2 Extra strength every 6 hours  Colds/Coughs/Allergies: Benadryl (alcohol free) 25 mg every 6 hours as needed Breath right strips Claritin Cepacol throat lozenges Chloraseptic throat spray Cold-Eeze- up to three times per day Cough drops, alcohol free Flonase (by prescription only) Guaifenesin Mucinex Robitussin DM (plain only, alcohol free) Saline nasal spray/drops Sudafed (pseudoephedrine) & Actifed ** use only after [redacted] weeks gestation and if you do not have high blood pressure Tylenol Vicks Vaporub Zinc lozenges Zyrtec   Constipation: Colace Ducolax suppositories Fleet enema Glycerin suppositories Metamucil Milk of magnesia Miralax Senokot Smooth move tea  Diarrhea: Kaopectate Imodium A-D  *NO pepto Bismol  Hemorrhoids: Anusol Anusol HC Preparation H Tucks  Indigestion: Tums Maalox Mylanta Zantac  Pepcid  Insomnia: Benadryl (alcohol free) 25mg every 6 hours as needed Tylenol PM Unisom, no Gelcaps  Leg Cramps: Tums MagGel  Nausea/Vomiting:  Bonine Dramamine Emetrol Ginger extract Sea bands Meclizine  Nausea medication to take during pregnancy:  Unisom (doxylamine succinate 25 mg tablets) Take one tablet daily at bedtime. If symptoms are not adequately controlled, the dose can be increased to a maximum recommended dose of two tablets daily (1/2 tablet in the morning, 1/2 tablet mid-afternoon and one at bedtime). Vitamin B6 100mg tablets. Take one tablet twice a day (up to 200 mg per day).  Skin Rashes: Aveeno products Benadryl cream or 25mg every 6 hours as needed Calamine Lotion 1% cortisone cream  Yeast infection: Gyne-lotrimin 7 Monistat 7   **If taking multiple medications, please check labels to avoid duplicating the same active ingredients **take medication as directed on  the label ** Do not exceed 4000 mg of tylenol in 24 hours **Do not take medications that contain aspirin or ibuprofen   Return to MAU: If you have heavier bleeding that soaks through more that 2 pads per hour for an hour or more If you bleed so much that you feel like you might pass out or you do pass out If you have significant abdominal pain that is not improved with Tylenol 1000 mg every 8 hours as needed for pain If you develop a fever > 100.5  

## 2023-10-10 ENCOUNTER — Other Ambulatory Visit: Payer: Self-pay

## 2023-10-10 ENCOUNTER — Inpatient Hospital Stay (HOSPITAL_COMMUNITY)
Admission: AD | Admit: 2023-10-10 | Discharge: 2023-10-10 | Disposition: A | Discharge status: Home / Self Care | Payer: Medicaid Other | Attending: Obstetrics and Gynecology | Admitting: Obstetrics and Gynecology

## 2023-10-10 ENCOUNTER — Encounter (HOSPITAL_COMMUNITY): Payer: Self-pay | Admitting: Obstetrics and Gynecology

## 2023-10-10 ENCOUNTER — Inpatient Hospital Stay (HOSPITAL_COMMUNITY): Payer: Medicaid Other

## 2023-10-10 DIAGNOSIS — Z3A08 8 weeks gestation of pregnancy: Secondary | ICD-10-CM | POA: Diagnosis not present

## 2023-10-10 DIAGNOSIS — O209 Hemorrhage in early pregnancy, unspecified: Secondary | ICD-10-CM | POA: Diagnosis present

## 2023-10-10 DIAGNOSIS — O039 Complete or unspecified spontaneous abortion without complication: Secondary | ICD-10-CM | POA: Diagnosis not present

## 2023-10-10 LAB — COMPREHENSIVE METABOLIC PANEL
ALT: 11 U/L (ref 0–44)
AST: 15 U/L (ref 15–41)
Albumin: 4 g/dL (ref 3.5–5.0)
Alkaline Phosphatase: 35 U/L — ABNORMAL LOW (ref 38–126)
Anion gap: 10 (ref 5–15)
BUN: 9 mg/dL (ref 6–20)
CO2: 22 mmol/L (ref 22–32)
Calcium: 9.4 mg/dL (ref 8.9–10.3)
Chloride: 103 mmol/L (ref 98–111)
Creatinine, Ser: 0.65 mg/dL (ref 0.44–1.00)
GFR, Estimated: >60 mL/min (ref >60)
Glucose, Bld: 74 mg/dL (ref 70–99)
Potassium: 3.5 mmol/L (ref 3.5–5.1)
Sodium: 135 mmol/L (ref 135–145)
Total Bilirubin: 0.5 mg/dL (ref 0.0–1.2)
Total Protein: 6.9 g/dL (ref 6.5–8.1)

## 2023-10-10 LAB — CBC WITH DIFFERENTIAL/PLATELET
Abs Immature Granulocytes: 0.01 10*3/uL (ref 0.00–0.07)
Basophils Absolute: 0 10*3/uL (ref 0.0–0.1)
Basophils Relative: 1 %
Eosinophils Absolute: 0 10*3/uL (ref 0.0–0.5)
Eosinophils Relative: 1 %
HCT: 36.4 % (ref 36.0–46.0)
Hemoglobin: 12.5 g/dL (ref 12.0–15.0)
Immature Granulocytes: 0 %
Lymphocytes Relative: 48 %
Lymphs Abs: 3.2 10*3/uL (ref 0.7–4.0)
MCH: 29.9 pg (ref 26.0–34.0)
MCHC: 34.3 g/dL (ref 30.0–36.0)
MCV: 87.1 fL (ref 80.0–100.0)
Monocytes Absolute: 0.4 10*3/uL (ref 0.1–1.0)
Monocytes Relative: 7 %
Neutro Abs: 2.8 10*3/uL (ref 1.7–7.7)
Neutrophils Relative %: 43 %
Platelets: 328 10*3/uL (ref 150–400)
RBC: 4.18 MIL/uL (ref 3.87–5.11)
RDW: 12.9 % (ref 11.5–15.5)
WBC: 6.5 10*3/uL (ref 4.0–10.5)
nRBC: 0 % (ref 0.0–0.2)

## 2023-10-10 LAB — HCG, QUANTITATIVE, PREGNANCY: hCG, Beta Chain, Quant, S: 22177 m[IU]/mL — ABNORMAL HIGH (ref <5)

## 2023-10-10 NOTE — MAU Note (Addendum)
 Anita Levy is a 28 y.o. at [redacted]w[redacted]d here in MAU reporting: she's having VB and cramping that began last night.  Reports cramps are worse than menstrual cramps, hasn't taken any meds to treat.  States VB is heavy and saturated a pad last night in 30 minutes.  RN assessed VB currently and small amount of dark red blood noted on toilet tissue between pt's legs (pt not wearing a sanitary napkin)  LMP: NA Onset of complaint: last night Pain score: 6 Vitals:   10/10/23 1327  BP: 100/79  Pulse: 92  Resp: 18  Temp: 98.6 F (37 C)  SpO2: 99%     FHT: unable to hear Lab orders placed from triage: UA

## 2023-10-10 NOTE — MAU Provider Note (Signed)
 History     CSN: [id number]  Arrival date and time: 10/10/23 1306   None     Chief Complaint  Patient presents with   Abdominal Pain   Vaginal Bleeding   HPI   [name] is a 28 y.o. female G2P0010 @ [redacted]w[redacted]d here in MAU with complaints of vaginal bleeding. Reports cramping and vaginal bleeding worsened last night. Was diagnosed with a moderate subchorionic hemorrhage via [place] at 8 weeks but reports not knowing anything about this. She had a 9 week loss of twins in the past.  No dizziness.   OB History     Gravida  2   Para      Term      Preterm      AB  1   Living         SAB  1   IAB      Ectopic      Multiple      Live Births              Past Medical History:  Diagnosis Date   HSV-1 infection     Past Surgical History:  Procedure Laterality Date   DILATION AND EVACUATION N/A 06/28/2023   Procedure: DILATATION AND EVACUATION;  Surgeon: Osborn Coho, MD;  Location: [facility] OR;  Service: Gynecology;  Laterality: N/A;   WISDOM TOOTH EXTRACTION      Family History  Problem Relation Age of Onset   Cancer Mother     Social History   Tobacco Use   Smoking status: Former    Current packs/day: 0.00    Types: Cigarettes    Quit date: 06/24/2023    Years since quitting: 0.2   Smokeless tobacco: Never  Vaping Use   Vaping status: Never Used  Substance Use Topics   Alcohol use: No    Alcohol/week: 0.0 standard drinks of alcohol   Drug use: Not Currently    Types: Marijuana    Comment: Last use was prior to pregnancy 12 wks ago    Allergies:  Allergies  Allergen Reactions   Codeine Itching   Penicillins Swelling    Medications Prior to Admission  Medication Sig Dispense Refill Last Dose/Taking   acetaminophen (TYLENOL) 500 MG tablet Take 2 tablets (1,000 mg total) by mouth every 6 (six) hours as needed (breakthrough pain). 40 tablet 1    Prenatal Vit-Fe Fumarate-FA (MULTIVITAMIN-PRENATAL) 27-0.8 MG TABS tablet Take 1 tablet by  mouth in the morning.      Results for orders placed or performed during the hospital encounter of 10/10/23 (from the past 48 hours)  CBC with Differential/Platelet     Status: None   Collection Time: 10/10/23  1:25 PM  Result Value Ref Range   WBC 6.5 4.0 - 10.5 K/uL   RBC 4.18 3.87 - 5.11 MIL/uL   Hemoglobin 12.5 12.0 - 15.0 g/dL   HCT 40.9 81.1 - 91.4 %   MCV 87.1 80.0 - 100.0 fL   MCH 29.9 26.0 - 34.0 pg   MCHC 34.3 30.0 - 36.0 g/dL   RDW 78.2 95.6 - 21.3 %   Platelets 328 150 - 400 K/uL   nRBC 0.0 0.0 - 0.2 %   Neutrophils Relative % 43 %   Neutro Abs 2.8 1.7 - 7.7 K/uL   Lymphocytes Relative 48 %   Lymphs Abs 3.2 0.7 - 4.0 K/uL   Monocytes Relative 7 %   Monocytes Absolute 0.4 0.1 - 1.0 K/uL   Eosinophils Relative  1 %   Eosinophils Absolute 0.0 0.0 - 0.5 K/uL   Basophils Relative 1 %   Basophils Absolute 0.0 0.0 - 0.1 K/uL   Immature Granulocytes 0 %   Abs Immature Granulocytes 0.01 0.00 - 0.07 K/uL    Comment: Performed at [facility], [address], [place], [place] [address]  Comprehensive metabolic panel     Status: Abnormal   Collection Time: 10/10/23  1:25 PM  Result Value Ref Range   Sodium 135 135 - 145 mmol/L   Potassium 3.5 3.5 - 5.1 mmol/L   Chloride 103 98 - 111 mmol/L   CO2 22 22 - 32 mmol/L   Glucose, Bld 74 70 - 99 mg/dL    Comment: Glucose reference range applies only to samples taken after fasting for at least 8 hours.   BUN 9 6 - 20 mg/dL   Creatinine, Ser 1.02 0.44 - 1.00 mg/dL   Calcium 9.4 8.9 - 72.5 mg/dL   Total Protein 6.9 6.5 - 8.1 g/dL   Albumin 4.0 3.5 - 5.0 g/dL   AST 15 15 - 41 U/L   ALT 11 0 - 44 U/L   Alkaline Phosphatase 35 (L) 38 - 126 U/L   Total Bilirubin 0.5 0.0 - 1.2 mg/dL   GFR, Estimated >36 >64 mL/min    Comment: (NOTE) Calculated using the CKD-EPI Creatinine Equation (2021)    Anion gap 10 5 - 15    Comment: Performed at [facility], [address], [place], [place] [address]  hCG, quantitative,  pregnancy     Status: Abnormal   Collection Time: 10/10/23  1:25 PM  Result Value Ref Range   hCG, Beta Chain, Quant, S 22,177 (H) <5 mIU/mL    Comment:          GEST. AGE      CONC.  (mIU/mL)   <=1 WEEK        5 - 50     2 WEEKS       50 - 500     3 WEEKS       100 - 10,000     4 WEEKS     1,000 - 30,000     5 WEEKS     3,500 - 115,000   6-8 WEEKS     12,000 - 270,000    12 WEEKS     15,000 - 220,000        FEMALE AND NON-PREGNANT FEMALE:     LESS THAN 5 mIU/mL Performed at [facility], [address] [place], [place] [address]      US OB Transvaginal Result Date: 10/10/2023 CLINICAL DATA:  [id number] Vaginal bleeding during pregnancy [id number] EXAM: TRANSVAGINAL OB ULTRASOUND TECHNIQUE: Transvaginal ultrasound was performed for complete evaluation of the gestation as well as the maternal uterus, adnexal regions, and pelvic cul-de-sac. COMPARISON:  09/30/2023 FINDINGS: Intrauterine gestational sac: Single Yolk sac:  Visualized. Embryo:  Visualized. Cardiac Activity: Not Visualized. Heart Rate: 0 bpm CRL:   20.8 mm   8 w 5 d Subchorionic hemorrhage:  None visualized. Maternal uterus/adnexae: Ovaries and adnexal regions within normal limits. No free fluid. IMPRESSION: No cardiac activity within an embryo measuring 28.8 mm by CRL. Findings meet definitive criteria for failed pregnancy. This follows SRU consensus guidelines: Diagnostic Criteria for Nonviable Pregnancy Early in the First Trimester. [facility]acy Mis J Med [phone]. Electronically Signed   By: Duanne Guess D.O.   On: 10/10/2023 16:53  Review of Systems  Gastrointestinal:  Positive for abdominal pain.  Genitourinary:  Positive for vaginal bleeding.   Physical Exam   Blood pressure 100/79, pulse 92, temperature 98.6 F (37 C), temperature source Oral, resp. rate 18, height 5\' 7"  (1.702 m), weight 56.1 kg, last menstrual period 07/31/2023, SpO2 99%.  Physical Exam Constitutional:      General: She is not in acute  distress.    Appearance: She is well-developed. She is not ill-appearing, toxic-appearing or diaphoretic.  Abdominal:     Tenderness: There is generalized abdominal tenderness. There is no rebound.  Neurological:     Mental Status: She is alert.    MAU Course  Procedures  MDM Beside [place] attempted Formal US shows IUP 8.5w gestation with no cardiac activity.  Discussed results in detail with patient and her partner who wish to be discharged home without discussion of treatment. Requests follow up with [facility].  Spoke to Dr. Su Hilt who will arrange for close f/u with their office.  O positive blood type.    A:  1. Miscarriage   2. [redacted] weeks gestation of pregnancy      P:  Dc home Return to MAU if symptoms worsen Follow up with [facility] Briefly discussed recurrent SAB workup and options for that with OB office.  Support given  Duane Lope, NP 10/10/2023 5:24 PM

## 2024-03-15 ENCOUNTER — Emergency Department (HOSPITAL_COMMUNITY)
Admission: EM | Admit: 2024-03-15 | Discharge: 2024-03-15 | Disposition: A | Discharge status: Home / Self Care | Attending: Emergency Medicine | Admitting: Emergency Medicine

## 2024-03-15 ENCOUNTER — Encounter (HOSPITAL_COMMUNITY): Payer: Self-pay

## 2024-03-15 ENCOUNTER — Other Ambulatory Visit: Payer: Self-pay

## 2024-03-15 ENCOUNTER — Emergency Department (HOSPITAL_COMMUNITY)

## 2024-03-15 DIAGNOSIS — S5002XA Contusion of left elbow, initial encounter: Secondary | ICD-10-CM | POA: Diagnosis not present

## 2024-03-15 DIAGNOSIS — W19XXXA Unspecified fall, initial encounter: Secondary | ICD-10-CM | POA: Diagnosis not present

## 2024-03-15 DIAGNOSIS — S60311A Abrasion of right thumb, initial encounter: Secondary | ICD-10-CM | POA: Insufficient documentation

## 2024-03-15 DIAGNOSIS — S59902A Unspecified injury of left elbow, initial encounter: Secondary | ICD-10-CM | POA: Diagnosis present

## 2024-03-15 DIAGNOSIS — Y9302 Activity, running: Secondary | ICD-10-CM | POA: Diagnosis not present

## 2024-03-15 MED ORDER — KETOROLAC TROMETHAMINE 15 MG/ML IJ SOLN
15.0000 mg | Freq: Once | INTRAMUSCULAR | Status: AC
  Administered 2024-03-15: 15 mg via INTRAMUSCULAR
  Filled 2024-03-15: qty 1

## 2024-03-15 NOTE — ED Provider Notes (Signed)
 [place] EMERGENCY DEPARTMENT AT [facility] Provider Note   CSN: [id number] Arrival date & time: 03/15/24  2048     Patient presents with: Arm Injury   Anita Levy is a 28 y.o. female.   HPI Presents after sustaining injury to her left elbow.  She was running, fell onto her left elbow and has been unable to move it since that time. No other injuries, no other complaints.  Trivial abrasion, right thumb.    Prior to Admission medications   Medication Sig Start Date End Date Taking? Authorizing Provider  acetaminophen  (TYLENOL ) 500 MG tablet Take 2 tablets (1,000 mg total) by mouth every 6 (six) hours as needed (breakthrough pain). 06/28/23   Henry Slough, MD  Prenatal Vit-Fe Fumarate-FA (MULTIVITAMIN-PRENATAL) 27-0.8 MG TABS tablet Take 1 tablet by mouth in the morning.    [provider]    Allergies: Codeine and Penicillins    Review of Systems  Updated Vital Signs BP 127/82 (BP Location: Right Arm)   Pulse 98   Temp 98.2 F (36.8 C) (Oral)   Resp 16   Ht 5' 7 (1.702 m)   Wt 58.1 kg   LMP  (LMP Unknown)   SpO2 98%   Breastfeeding Unknown   BMI 20.05 kg/m   Physical Exam Vitals and nursing note reviewed.  Constitutional:      General: She is not in acute distress.    Appearance: She is well-developed.  HENT:     Head: Normocephalic and atraumatic.  Eyes:     Conjunctiva/sclera: Conjunctivae normal.  Cardiovascular:     Rate and Rhythm: Normal rate and regular rhythm.     Pulses: Normal pulses.  Pulmonary:     Effort: Pulmonary effort is normal. No respiratory distress.     Breath sounds: Normal breath sounds. No stridor.  Abdominal:     General: There is no distension.  Musculoskeletal:       Arms:  Skin:    General: Skin is warm and dry.  Neurological:     Mental Status: She is alert and oriented to person, place, and time.     Cranial Nerves: No cranial nerve deficit.  Psychiatric:        Mood and Affect: Mood  normal.     (all labs ordered are listed, but only abnormal results are displayed) Labs Reviewed - No data to display  EKG: None  Radiology: CT Elbow Left Wo Contrast Result Date: 03/15/2024 CLINICAL DATA:  Clemens onto left hand and elbow, left elbow effusion on x-ray with no corresponding fracture identified, concern for occult fracture EXAM: CT OF THE UPPER LEFT EXTREMITY WITHOUT CONTRAST TECHNIQUE: Multidetector CT imaging of the upper left extremity was performed according to the standard protocol. RADIATION DOSE REDUCTION: This exam was performed according to the departmental dose-optimization program which includes automated exposure control, adjustment of the mA and/or kV according to patient size and/or use of iterative reconstruction technique. COMPARISON:  03/15/2024 FINDINGS: Bones/Joint/Cartilage There are no acute fractures. Alignment is anatomic. Joint spaces are well preserved. Left elbow effusion, with elevation of the anterior and posterior fat pads as seen on earlier x-ray. Ligaments Suboptimally assessed by CT. Muscles and Tendons No acute findings. Soft tissues Soft tissues are grossly unremarkable. Reconstructed images demonstrate no additional findings. IMPRESSION: 1. Left elbow effusion. 2. Otherwise unremarkable left elbow.  No acute fracture identified. Electronically Signed   By: Ozell Daring M.D.   On: 03/15/2024 22:12   DG Elbow Complete Left Result  Date: 03/15/2024 CLINICAL DATA:  Left elbow pain, fell while running EXAM: LEFT ELBOW - COMPLETE 3+ VIEW COMPARISON:  None Available. FINDINGS: Frontal, bilateral oblique, and lateral views of the left elbow are obtained. There are no acute displaced fractures. Left elbow effusion with elevation of the anterior and posterior fat pads may signify an occult radial head fracture in a skeletally immature patient. Alignment is anatomic. Joint spaces are well preserved. Soft tissues are otherwise unremarkable. IMPRESSION: 1. No acute  displaced fracture. 2. Left elbow effusion. In the setting of acute trauma in a skeletally mature patient, the presence of a joint effusion may signify an occult radial head fracture. Electronically Signed   By: Ozell Daring M.D.   On: 03/15/2024 21:19     Procedures   Medications Ordered in the ED  ketorolac  (TORADOL ) 15 MG/ML injection 15 mg (15 mg Intramuscular Given 03/15/24 2130)                                    Medical Decision Making Previously well adult female presents after sustaining an injury to her left elbow.  Patient is distally neurovascular intact, has substantial pain, inability move the elbow substantially.  X-ray nondiagnostic, CT scan ordered, Toradol  provided.  Amount and/or Complexity of Data Reviewed Radiology: ordered and independent interpretation performed. Decision-making details documented in ED Course.  Risk Prescription drug management.  X-ray nondiagnostic with effusion, CT ordered 10:22 PM In no distress, sling in place, CT reviewed, no fracture.  We discussed follow-up instructions, care instructions, and absent other complaints, with concern for elbow contusion patient discharged in stable condition.     Final diagnoses:  Fall, initial encounter  Contusion of left elbow, initial encounter    ED Discharge Orders     None          Garrick Charleston, MD 03/15/24 2222

## 2024-03-15 NOTE — ED Triage Notes (Signed)
 Running down hill and fell onto the left hand and elbow. Happened around 2 hours ago. Hurting in the left elbow. Had a previous injury to the same elbow.

## 2024-03-15 NOTE — Discharge Instructions (Signed)
 As discussed, your evaluation today has been largely reassuring.  But, it is important that you monitor your condition carefully, and do not hesitate to return to the ED if you develop new, or concerning changes in your condition. ? ?Otherwise, please follow-up with your physician for appropriate ongoing care. ? ?

## 2024-06-22 ENCOUNTER — Encounter: Payer: Self-pay | Admitting: *Deleted
